# Patient Record
Sex: Female | Born: 1965 | Race: Asian | Hispanic: No | Marital: Married | Smoking: Never smoker
Health system: Southern US, Community
[De-identification: ages and names within clinical notes are randomized; demographics above are authoritative.]

## PROBLEM LIST (undated history)

## (undated) DIAGNOSIS — K219 Gastro-esophageal reflux disease without esophagitis: Secondary | ICD-10-CM

---

## 2009-02-07 ENCOUNTER — Ambulatory Visit: Payer: Self-pay | Admitting: Family Medicine

## 2010-01-21 ENCOUNTER — Ambulatory Visit: Payer: Self-pay | Admitting: Family Medicine

## 2014-04-11 LAB — BASIC METABOLIC PANEL
BUN: 5 mg/dL (ref 4–21)
Creatinine: 0.7 mg/dL (ref 0.5–1.1)

## 2014-04-11 LAB — CBC AND DIFFERENTIAL: HEMOGLOBIN: 13.1 g/dL (ref 12.0–16.0)

## 2014-04-11 LAB — LIPID PANEL
CHOLESTEROL: 229 mg/dL — AB (ref 0–200)
HDL: 61 mg/dL (ref 35–70)
LDL Cholesterol: 151 mg/dL
Triglycerides: 85 mg/dL (ref 40–160)

## 2014-04-11 LAB — TSH: TSH: 1.06 u[IU]/mL (ref 0.41–5.90)

## 2014-06-14 ENCOUNTER — Ambulatory Visit: Payer: Self-pay | Admitting: Internal Medicine

## 2014-06-14 LAB — HM MAMMOGRAPHY: HM MAMMO: NORMAL

## 2015-04-20 ENCOUNTER — Encounter: Payer: Self-pay | Admitting: Internal Medicine

## 2015-04-20 DIAGNOSIS — Z833 Family history of diabetes mellitus: Secondary | ICD-10-CM | POA: Insufficient documentation

## 2016-05-23 ENCOUNTER — Encounter: Payer: Self-pay | Admitting: Internal Medicine

## 2016-09-22 ENCOUNTER — Encounter: Payer: Self-pay | Admitting: Internal Medicine

## 2016-09-22 DIAGNOSIS — E782 Mixed hyperlipidemia: Secondary | ICD-10-CM | POA: Insufficient documentation

## 2016-09-23 ENCOUNTER — Ambulatory Visit (INDEPENDENT_AMBULATORY_CARE_PROVIDER_SITE_OTHER): Payer: BLUE CROSS/BLUE SHIELD | Admitting: Internal Medicine

## 2016-09-23 ENCOUNTER — Encounter: Payer: Self-pay | Admitting: Internal Medicine

## 2016-09-23 VITALS — BP 102/64 | HR 66 | Ht 65.0 in | Wt 151.6 lb

## 2016-09-23 DIAGNOSIS — E782 Mixed hyperlipidemia: Secondary | ICD-10-CM

## 2016-09-23 DIAGNOSIS — Z1231 Encounter for screening mammogram for malignant neoplasm of breast: Secondary | ICD-10-CM | POA: Diagnosis not present

## 2016-09-23 DIAGNOSIS — Z833 Family history of diabetes mellitus: Secondary | ICD-10-CM | POA: Diagnosis not present

## 2016-09-23 DIAGNOSIS — Z0000 Encounter for general adult medical examination without abnormal findings: Secondary | ICD-10-CM

## 2016-09-23 DIAGNOSIS — Z1211 Encounter for screening for malignant neoplasm of colon: Secondary | ICD-10-CM | POA: Diagnosis not present

## 2016-09-23 DIAGNOSIS — Z1239 Encounter for other screening for malignant neoplasm of breast: Secondary | ICD-10-CM

## 2016-09-23 LAB — POCT URINALYSIS DIPSTICK
Bilirubin, UA: NEGATIVE
Glucose, UA: NEGATIVE
Ketones, UA: NEGATIVE
Leukocytes, UA: NEGATIVE
NITRITE UA: NEGATIVE
PH UA: 7.5 (ref 5.0–8.0)
PROTEIN UA: NEGATIVE
RBC UA: NEGATIVE
UROBILINOGEN UA: 0.2 U/dL

## 2016-09-23 NOTE — Patient Instructions (Addendum)
Call to schedule mammogram [phone] Breast Self-Awareness Breast self-awareness means being familiar with how your breasts look and feel. It involves checking your breasts regularly and reporting any changes to your health care provider. Practicing breast self-awareness is important. A change in your breasts can be a sign of a serious medical problem. Being familiar with how your breasts look and feel allows you to find any problems early, when treatment is more likely to be successful. All women should practice breast self-awareness, including women who have had breast implants. How to do a breast self-exam One way to learn what is normal for your breasts and whether your breasts are changing is to do a breast self-exam. To do a breast self-exam: Look for Changes   1. Remove all the clothing above your waist. 2. Stand in front of a mirror in a room with good lighting. 3. Put your hands on your hips. 4. Push your hands firmly downward. 5. Compare your breasts in the mirror. Look for differences between them (asymmetry), such as:  Differences in shape.  Differences in size.  Puckers, dips, and bumps in one breast and not the other. 6. Look at each breast for changes in your skin, such as:  Redness.  Scaly areas. 7. Look for changes in your nipples, such as:  Discharge.  Bleeding.  Dimpling.  Redness.  A change in position. Feel for Changes   Carefully feel your breasts for lumps and changes. It is best to do this while lying on your back on the floor and again while sitting or standing in the shower or tub with soapy water on your skin. Feel each breast in the following way:  Place the arm on the side of the breast you are examining above your head.  Feel your breast with the other hand.  Start in the nipple area and make  inch (2 cm) overlapping circles to feel your breast. Use the pads of your three middle fingers to do this. Apply light pressure, then medium  pressure, then firm pressure. The light pressure will allow you to feel the tissue closest to the skin. The medium pressure will allow you to feel the tissue that is a little deeper. The firm pressure will allow you to feel the tissue close to the ribs.  Continue the overlapping circles, moving downward over the breast until you feel your ribs below your breast.  Move one finger-width toward the center of the body. Continue to use the  inch (2 cm) overlapping circles to feel your breast as you move slowly up toward your collarbone.  Continue the up and down exam using all three pressures until you reach your armpit. Write Down What You Find   Write down what is normal for each breast and any changes that you find. Keep a written record with breast changes or normal findings for each breast. By writing this information down, you do not need to depend only on memory for size, tenderness, or location. Write down where you are in your menstrual cycle, if you are still menstruating. If you are having trouble noticing differences in your breasts, do not get discouraged. With time you will become more familiar with the variations in your breasts and more comfortable with the exam. How often should I examine my breasts? Examine your breasts every month. If you are breastfeeding, the best time to examine your breasts is after a feeding or after using a breast pump. If you menstruate, the best time to examine  your breasts is 5-7 days after your period is over. During your period, your breasts are lumpier, and it may be more difficult to notice changes. When should I see my health care provider? See your health care provider if you notice:  A change in shape or size of your breasts or nipples.  A change in the skin of your breast or nipples, such as a reddened or scaly area.  Unusual discharge from your nipples.  A lump or thick area that was not there before.  Pain in your breasts.  Anything that  concerns you. This information is not intended to replace advice given to you by your health care provider. Make sure you discuss any questions you have with your health care provider. Document Released: 05/12/2005 Document Revised: 10/18/2015 Document Reviewed: 04/01/2015 Elsevier Interactive Patient Education  2017 [employer].

## 2016-09-23 NOTE — Progress Notes (Signed)
Date:  09/23/2016   Name:  Brittany Wiley   DOB:  [birth year]   MRN:  [id number]   Chief Complaint: Annual Exam (pap smear and breast exam. ) Brittany Wiley is a 51 y.o. female who presents today for her Complete Annual Exam. She feels well. She reports exercising none but has a physical job. She reports she is sleeping poorly. She does not want to take any otc or prescription medication at this time.  She is due for a mammogram.  Last PAP was a number of years ago in [place].  She has not had a colonoscopy.  There is no family hx of colon cancer.   Review of Systems  Constitutional: Negative for appetite change, chills, fatigue, fever and unexpected weight change.  HENT: Negative for hearing loss and trouble swallowing.   Eyes: Negative for visual disturbance.  Respiratory: Negative for cough, chest tightness, shortness of breath and wheezing.   Cardiovascular: Negative for chest pain.  Gastrointestinal: Negative for abdominal pain, constipation and diarrhea.  Genitourinary: Negative for difficulty urinating, frequency and menstrual problem.  Musculoskeletal: Negative for arthralgias.  Skin: Negative for color change and rash.  Neurological: Negative for dizziness, numbness and headaches.  Hematological: Negative for adenopathy.  Psychiatric/Behavioral: Positive for sleep disturbance. Negative for dysphoric mood.    Patient Active Problem List   Diagnosis Date Noted  . Hyperlipidemia, mixed 09/22/2016  . Family history of diabetes mellitus 04/20/2015    Prior to Admission medications   Not on File    No Known Allergies  History reviewed. No pertinent surgical history.  Social History  Substance Use Topics  . Smoking status: Never Smoker  . Smokeless tobacco: Never Used  . Alcohol use 0.0 oz/week     Comment: rarely     Medication list has been reviewed and updated.   Physical Exam  Constitutional: She is oriented to person, place, and time. She appears well-developed  and well-nourished. No distress.  HENT:  Head: Normocephalic and atraumatic.  Right Ear: Tympanic membrane and ear canal normal.  Left Ear: Tympanic membrane and ear canal normal.  Nose: Right sinus exhibits no maxillary sinus tenderness. Left sinus exhibits no maxillary sinus tenderness.  Mouth/Throat: Uvula is midline and oropharynx is clear and moist.  Eyes: Conjunctivae and EOM are normal. Right eye exhibits no discharge. Left eye exhibits no discharge. No scleral icterus.  Neck: Normal range of motion. Carotid bruit is not present. No erythema present. No thyromegaly present.  Cardiovascular: Normal rate, regular rhythm, normal heart sounds and normal pulses.   Pulmonary/Chest: Effort normal. No respiratory distress. She has no wheezes. Right breast exhibits no mass, no nipple discharge, no skin change and no tenderness. Left breast exhibits no mass, no nipple discharge, no skin change and no tenderness.  Abdominal: Soft. Bowel sounds are normal. There is no hepatosplenomegaly. There is no tenderness. There is no CVA tenderness.  Genitourinary: Vagina normal and uterus normal. There is no tenderness, lesion or injury on the right labia. There is no tenderness, lesion or injury on the left labia. Cervix exhibits no motion tenderness, no discharge and no friability. Right adnexum displays no mass, no tenderness and no fullness. Left adnexum displays no mass, no tenderness and no fullness.  Musculoskeletal: Normal range of motion. She exhibits no edema or tenderness.  Lymphadenopathy:    She has no cervical adenopathy.    She has no axillary adenopathy.  Neurological: She is alert and oriented to person, place, and time.  She has normal reflexes. No cranial nerve deficit or sensory deficit.  Skin: Skin is warm, dry and intact. No rash noted.  Psychiatric: She has a normal mood and affect. Her speech is normal and behavior is normal. Thought content normal.  Nursing note and vitals  reviewed.   BP 102/64 (BP Location: Right Arm, Patient Position: Sitting, Cuff Size: Large)   Pulse 66   Ht 5\' 5"  (1.651 m)   Wt 151 lb 9.6 oz (68.8 kg)   LMP 09/09/2016 (Within Weeks)   SpO2 100%   BMI 25.23 kg/m   Assessment and Plan: 1. Annual physical exam Resume regular exercise Consider Melatonin for sleep - CBC with Differential/Platelet - TSH - POCT urinalysis dipstick - Pap IG and HPV (high risk) DNA detection  2. Breast cancer screening - MM DIGITAL SCREENING BILATERAL; Future  3. Hyperlipidemia, mixed Will advise if medication is needed - Lipid panel  4. Family history of diabetes mellitus - Comprehensive metabolic panel - Hemoglobin A1c  5. Colon cancer screening - Ambulatory referral to Gastroenterology   No orders of the defined types were placed in this encounter.   Halina Maidens, MD [facility]  09/23/2016

## 2016-09-24 ENCOUNTER — Other Ambulatory Visit: Payer: Self-pay

## 2016-09-24 DIAGNOSIS — Z1211 Encounter for screening for malignant neoplasm of colon: Secondary | ICD-10-CM

## 2016-09-24 LAB — CBC WITH DIFFERENTIAL/PLATELET
Basophils Absolute: 0.1 10*3/uL (ref 0.0–0.2)
Basos: 1 %
EOS (ABSOLUTE): 0.2 10*3/uL (ref 0.0–0.4)
EOS: 4 %
HEMATOCRIT: 37.9 % (ref 34.0–46.6)
Hemoglobin: 12 g/dL (ref 11.1–15.9)
Immature Grans (Abs): 0 10*3/uL (ref 0.0–0.1)
Immature Granulocytes: 0 %
LYMPHS ABS: 1.8 10*3/uL (ref 0.7–3.1)
Lymphs: 38 %
MCH: 29.1 pg (ref 26.6–33.0)
MCHC: 31.7 g/dL (ref 31.5–35.7)
MCV: 92 fL (ref 79–97)
MONOS ABS: 0.3 10*3/uL (ref 0.1–0.9)
Monocytes: 6 %
Neutrophils Absolute: 2.4 10*3/uL (ref 1.4–7.0)
Neutrophils: 51 %
PLATELETS: 293 10*3/uL (ref 150–379)
RBC: 4.12 x10E6/uL (ref 3.77–5.28)
RDW: 15.1 % (ref 12.3–15.4)
WBC: 4.7 10*3/uL (ref 3.4–10.8)

## 2016-09-24 LAB — COMPREHENSIVE METABOLIC PANEL
A/G RATIO: 1.4 (ref 1.2–2.2)
ALK PHOS: 60 IU/L (ref 39–117)
ALT: 12 IU/L (ref 0–32)
AST: 16 IU/L (ref 0–40)
Albumin: 4.1 g/dL (ref 3.5–5.5)
BUN/Creatinine Ratio: 7 — ABNORMAL LOW (ref 9–23)
BUN: 5 mg/dL — AB (ref 6–24)
Bilirubin Total: 0.4 mg/dL (ref 0.0–1.2)
CHLORIDE: 102 mmol/L (ref 96–106)
CO2: 22 mmol/L (ref 18–29)
CREATININE: 0.73 mg/dL (ref 0.57–1.00)
Calcium: 8.5 mg/dL — ABNORMAL LOW (ref 8.7–10.2)
GFR, EST AFRICAN AMERICAN: 111 mL/min/{1.73_m2} (ref >59)
GFR, EST NON AFRICAN AMERICAN: 96 mL/min/{1.73_m2} (ref >59)
GLOBULIN, TOTAL: 2.9 g/dL (ref 1.5–4.5)
Glucose: 76 mg/dL (ref 65–99)
Potassium: 3.7 mmol/L (ref 3.5–5.2)
Sodium: 140 mmol/L (ref 134–144)
Total Protein: 7 g/dL (ref 6.0–8.5)

## 2016-09-24 LAB — LIPID PANEL
Chol/HDL Ratio: 3.2 ratio (ref 0.0–4.4)
Cholesterol, Total: 205 mg/dL — ABNORMAL HIGH (ref 100–199)
HDL: 64 mg/dL (ref >39)
LDL CALC: 123 mg/dL — AB (ref 0–99)
Triglycerides: 91 mg/dL (ref 0–149)
VLDL Cholesterol Cal: 18 mg/dL (ref 5–40)

## 2016-09-24 LAB — HEMOGLOBIN A1C
Est. average glucose Bld gHb Est-mCnc: 108 mg/dL
Hgb A1c MFr Bld: 5.4 % (ref 4.8–5.6)

## 2016-09-24 LAB — TSH: TSH: 1.61 u[IU]/mL (ref 0.450–4.500)

## 2016-09-25 ENCOUNTER — Telehealth: Payer: Self-pay | Admitting: Gastroenterology

## 2016-09-25 LAB — PAP IG AND HPV HIGH-RISK
HPV, high-risk: NEGATIVE
PAP SMEAR COMMENT: 0

## 2016-09-25 NOTE — Telephone Encounter (Signed)
09/25/16 Spoke with Fabiola Backer at [facility] and NO prior Josem Kaufmann is required for Colonoscopy [phone] / Z12.11

## 2016-09-26 NOTE — Progress Notes (Signed)
Pt informed of normal pap negative HPV

## 2016-10-17 ENCOUNTER — Encounter: Payer: Self-pay | Admitting: *Deleted

## 2016-10-21 ENCOUNTER — Encounter
Admission: RE | Disposition: A | Discharge status: Home / Self Care | Payer: Self-pay | Source: Ambulatory Visit | Attending: Gastroenterology

## 2016-10-21 ENCOUNTER — Ambulatory Visit
Admission: RE | Admit: 2016-10-21 | Discharge: 2016-10-21 | Disposition: A | Discharge status: Home / Self Care | Payer: BLUE CROSS/BLUE SHIELD | Source: Ambulatory Visit | Attending: Gastroenterology | Admitting: Gastroenterology

## 2016-10-21 ENCOUNTER — Ambulatory Visit: Payer: BLUE CROSS/BLUE SHIELD | Admitting: Certified Registered"

## 2016-10-21 DIAGNOSIS — K5730 Diverticulosis of large intestine without perforation or abscess without bleeding: Secondary | ICD-10-CM | POA: Insufficient documentation

## 2016-10-21 DIAGNOSIS — D125 Benign neoplasm of sigmoid colon: Secondary | ICD-10-CM | POA: Diagnosis not present

## 2016-10-21 DIAGNOSIS — K648 Other hemorrhoids: Secondary | ICD-10-CM | POA: Insufficient documentation

## 2016-10-21 DIAGNOSIS — D123 Benign neoplasm of transverse colon: Secondary | ICD-10-CM | POA: Insufficient documentation

## 2016-10-21 DIAGNOSIS — Z1211 Encounter for screening for malignant neoplasm of colon: Secondary | ICD-10-CM | POA: Diagnosis present

## 2016-10-21 DIAGNOSIS — K219 Gastro-esophageal reflux disease without esophagitis: Secondary | ICD-10-CM | POA: Diagnosis not present

## 2016-10-21 DIAGNOSIS — K635 Polyp of colon: Secondary | ICD-10-CM

## 2016-10-21 HISTORY — DX: Gastro-esophageal reflux disease without esophagitis: K21.9

## 2016-10-21 HISTORY — PX: COLONOSCOPY WITH PROPOFOL: SHX5780

## 2016-10-21 LAB — POCT PREGNANCY, URINE: Preg Test, Ur: NEGATIVE

## 2016-10-21 SURGERY — COLONOSCOPY WITH PROPOFOL
Anesthesia: General

## 2016-10-21 MED ORDER — PROPOFOL 500 MG/50ML IV EMUL
INTRAVENOUS | Status: AC
  Filled 2016-10-21: qty 50

## 2016-10-21 MED ORDER — PROPOFOL 10 MG/ML IV BOLUS
INTRAVENOUS | Status: DC | PRN
  Administered 2016-10-21: 70 mg via INTRAVENOUS
  Administered 2016-10-21: 30 mg via INTRAVENOUS

## 2016-10-21 MED ORDER — PROPOFOL 500 MG/50ML IV EMUL
INTRAVENOUS | Status: DC | PRN
  Administered 2016-10-21: 120 ug/kg/min via INTRAVENOUS

## 2016-10-21 MED ORDER — SODIUM CHLORIDE 0.9 % IV SOLN
INTRAVENOUS | Status: DC
  Administered 2016-10-21: 09:00:00 via INTRAVENOUS

## 2016-10-21 NOTE — Transfer of Care (Signed)
Immediate Anesthesia Transfer of Care Note  Patient: Brittany Wiley  Procedure(s) Performed: Procedure(s): COLONOSCOPY WITH PROPOFOL (N/A)  Patient Location: Endoscopy Unit  Anesthesia Type:General  Level of Consciousness: awake  Airway & Oxygen Therapy: Patient Spontanous Breathing and Patient connected to nasal cannula oxygen  Post-op Assessment: Report given to RN and Post -op Vital signs reviewed and stable  Post vital signs: Reviewed  Last Vitals:  Vitals:   10/21/16 0843 10/21/16 0933  BP: 121/70 (!) 89/58  Pulse: 65 75  Resp: 16 18  Temp: 36.4 C (!) 35.8 C    Last Pain:  Vitals:   10/21/16 0843  TempSrc: Tympanic         Complications: No apparent anesthesia complications

## 2016-10-21 NOTE — H&P (Signed)
   Lucilla Lame, MD [facility] [address] [place], [place] [address] Phone: [phone] Fax : [phone]  Primary Care Physician:  Glean Hess, MD Primary Gastroenterologist:  Dr. Allen Norris  Pre-Procedure History & Physical: HPI:  Brittany Wiley is a 51 y.o. female is here for a screening colonoscopy.   Past Medical History:  Diagnosis Date  . GERD (gastroesophageal reflux disease)     Past Surgical History:  Procedure Laterality Date  . CESAREAN SECTION      Prior to Admission medications   Not on File    Allergies as of 09/24/2016  . (No Known Allergies)    Family History  Problem Relation Age of Onset  . Hypertension Father   . Diabetes Mother     Social History   Social History  . Marital status: Married    Spouse name: N/A  . Number of children: N/A  . Years of education: N/A   Occupational History  . Not on file.   Social History Main Topics  . Smoking status: Never Smoker  . Smokeless tobacco: Never Used  . Alcohol use 0.0 oz/week     Comment: rarely  . Drug use: No  . Sexual activity: Yes   Other Topics Concern  . Not on file   Social History Narrative  . No narrative on file    Review of Systems: See HPI, otherwise negative ROS  Physical Exam: BP 121/70   Pulse 65   Temp 97.5 F (36.4 C) (Tympanic)   Resp 16   Ht 5\' 7"  (1.702 m)   Wt 155 lb (70.3 kg)   LMP 10/21/2016   SpO2 100%   BMI 24.28 kg/m  General:   Alert,  pleasant and cooperative in NAD Head:  Normocephalic and atraumatic. Neck:  Supple; no masses or thyromegaly. Lungs:  Clear throughout to auscultation.    Heart:  Regular rate and rhythm. Abdomen:  Soft, nontender and nondistended. Normal bowel sounds, without guarding, and without rebound.   Neurologic:  Alert and  oriented x4;  grossly normal neurologically.  Impression/Plan: Brittany Wiley is now here to undergo a screening colonoscopy.  Risks, benefits, and alternatives regarding colonoscopy have been  reviewed with the patient.  Questions have been answered.  All parties agreeable.

## 2016-10-21 NOTE — Anesthesia Postprocedure Evaluation (Signed)
Anesthesia Post Note  Patient: Brittany Wiley  Procedure(s) Performed: Procedure(s) (LRB): COLONOSCOPY WITH PROPOFOL (N/A)  Patient location during evaluation: Endoscopy Anesthesia Type: General Level of consciousness: awake and alert Pain management: pain level controlled Vital Signs Assessment: post-procedure vital signs reviewed and stable Respiratory status: spontaneous breathing, nonlabored ventilation, respiratory function stable and patient connected to nasal cannula oxygen Cardiovascular status: blood pressure returned to baseline and stable Postop Assessment: no signs of nausea or vomiting Anesthetic complications: no     Last Vitals:  Vitals:   10/21/16 0943 10/21/16 0953  BP: 111/77 117/73  Pulse:    Resp:    Temp:      Last Pain:  Vitals:   10/21/16 0933  TempSrc: Oral                 [name]

## 2016-10-21 NOTE — Anesthesia Preprocedure Evaluation (Signed)
Anesthesia Evaluation  Patient identified by MRN, date of birth, ID band Patient awake    Reviewed: Allergy & Precautions, H&P , NPO status , Patient's Chart, lab work & pertinent test results  History of Anesthesia Complications Negative for: history of anesthetic complications  Airway Mallampati: III  TM Distance: >3 FB Neck ROM: full    Dental  (+) Chipped, Caps   Pulmonary neg pulmonary ROS, neg shortness of breath,    Pulmonary exam normal breath sounds clear to auscultation       Cardiovascular Exercise Tolerance: Good (-) angina(-) Past MI and (-) DOE negative cardio ROS Normal cardiovascular exam Rhythm:regular Rate:Normal     Neuro/Psych negative neurological ROS  negative psych ROS   GI/Hepatic Neg liver ROS, GERD  Controlled,  Endo/Other  negative endocrine ROS  Renal/GU negative Renal ROS  negative genitourinary   Musculoskeletal   Abdominal   Peds  Hematology negative hematology ROS (+)   Anesthesia Other Findings Past Medical History: No date: GERD (gastroesophageal reflux disease)  Past Surgical History: No date: CESAREAN SECTION  BMI    Body Mass Index:  24.28 kg/m      Reproductive/Obstetrics negative OB ROS                             Anesthesia Physical Anesthesia Plan  ASA: II  Anesthesia Plan: General   Post-op Pain Management:    Induction: Intravenous  Airway Management Planned: Natural Airway and Nasal Cannula  Additional Equipment:   Intra-op Plan:   Post-operative Plan:   Informed Consent: I have reviewed the patients History and Physical, chart, labs and discussed the procedure including the risks, benefits and alternatives for the proposed anesthesia with the patient or authorized representative who has indicated his/her understanding and acceptance.   Dental Advisory Given  Plan Discussed with: Anesthesiologist, CRNA and  Surgeon  Anesthesia Plan Comments: (Patient consented for risks of anesthesia including but not limited to:  - adverse reactions to medications - damage to teeth, lips or other oral mucosa - sore throat or hoarseness - Damage to heart, brain, lungs or loss of life  Patient voiced understanding.)        Anesthesia Quick Evaluation

## 2016-10-21 NOTE — Op Note (Signed)
[facility] Patient Name: Brittany Wiley Procedure Date: 10/21/2016 9:11 AM MRN: [id number] Account #: [id number] Date of Birth: 04/27/1966 Admit Type: Outpatient Age: 51 Room: [facility] ENDO ROOM 4 Gender: Female Note Status: Finalized Procedure:            Colonoscopy Indications:          Screening for colorectal malignant neoplasm Providers:            Lucilla Lame MD, MD Referring MD:         Halina Maidens, MD (Referring MD) Medicines:            Propofol per Anesthesia Complications:        No immediate complications. Procedure:            Pre-Anesthesia Assessment:                       - Prior to the procedure, a History and Physical was                        performed, and patient medications and allergies were                        reviewed. The patient's tolerance of previous                        anesthesia was also reviewed. The risks and benefits of                        the procedure and the sedation options and risks were                        discussed with the patient. All questions were                        answered, and informed consent was obtained. Prior                        Anticoagulants: The patient has taken no previous                        anticoagulant or antiplatelet agents. ASA Grade                        Assessment: II - A patient with mild systemic disease.                        After reviewing the risks and benefits, the patient was                        deemed in satisfactory condition to undergo the                        procedure.                       After obtaining informed consent, the colonoscope was                        passed under direct vision. Throughout the procedure,  the patient's blood pressure, pulse, and oxygen                        saturations were monitored continuously. The                        Colonoscope was introduced through the anus and                         advanced to the the cecum, identified by appendiceal                        orifice and ileocecal valve. The colonoscopy was                        performed without difficulty. The patient tolerated the                        procedure well. The quality of the bowel preparation                        was excellent. Findings:      The perianal and digital rectal examinations were normal.      A 4 mm polyp was found in the sigmoid colon. The polyp was sessile. The       polyp was removed with a cold snare. Resection and retrieval were       complete.      A few small-mouthed diverticula were found in the ascending colon.      Non-bleeding internal hemorrhoids were found during retroflexion. The       hemorrhoids were Grade I (internal hemorrhoids that do not prolapse). Impression:           - One 4 mm polyp in the sigmoid colon, removed with a                        cold snare. Resected and retrieved.                       - Diverticulosis in the ascending colon.                       - Non-bleeding internal hemorrhoids. Recommendation:       - Discharge patient to home.                       - Resume previous diet.                       - Continue present medications.                       - Await pathology results.                       - Repeat colonoscopy in 5 years if polyp adenoma and 10                        years if hyperplastic Procedure Code(s):    --- Professional ---                       [phone],  Colonoscopy, flexible; with removal of tumor(s),                        polyp(s), or other lesion(s) by snare technique Diagnosis Code(s):    --- Professional ---                       Z12.11, Encounter for screening for malignant neoplasm                        of colon                       D12.5, Benign neoplasm of sigmoid colon CPT copyright 2016 American [employer]. All rights reserved. The codes documented in this report are preliminary and upon coder review may   be revised to meet current compliance requirements. Lucilla Lame MD, MD 10/21/2016 9:31:23 AM This report has been signed electronically. Number of Addenda: 0 Note Initiated On: 10/21/2016 9:11 AM Scope Withdrawal Time: 0 hours 6 minutes 52 seconds  Total Procedure Duration: 0 hours 8 minutes 57 seconds       [facility]

## 2016-10-21 NOTE — Anesthesia Post-op Follow-up Note (Cosign Needed)
Anesthesia QCDR form completed.        

## 2016-10-22 ENCOUNTER — Encounter: Payer: Self-pay | Admitting: Gastroenterology

## 2016-10-22 LAB — SURGICAL PATHOLOGY

## 2016-10-23 ENCOUNTER — Encounter: Payer: Self-pay | Admitting: Gastroenterology

## 2017-02-03 ENCOUNTER — Ambulatory Visit
Admission: RE | Admit: 2017-02-03 | Discharge: 2017-02-03 | Disposition: A | Discharge status: Home / Self Care | Payer: BLUE CROSS/BLUE SHIELD | Source: Ambulatory Visit | Attending: Internal Medicine | Admitting: Internal Medicine

## 2017-02-03 DIAGNOSIS — Z1231 Encounter for screening mammogram for malignant neoplasm of breast: Secondary | ICD-10-CM | POA: Insufficient documentation

## 2017-02-03 DIAGNOSIS — R928 Other abnormal and inconclusive findings on diagnostic imaging of breast: Secondary | ICD-10-CM | POA: Insufficient documentation

## 2017-02-03 DIAGNOSIS — N6489 Other specified disorders of breast: Secondary | ICD-10-CM | POA: Diagnosis not present

## 2017-02-03 DIAGNOSIS — Z1239 Encounter for other screening for malignant neoplasm of breast: Secondary | ICD-10-CM

## 2017-02-05 ENCOUNTER — Other Ambulatory Visit: Payer: Self-pay | Admitting: Internal Medicine

## 2017-02-05 DIAGNOSIS — N6489 Other specified disorders of breast: Secondary | ICD-10-CM

## 2017-02-05 DIAGNOSIS — R928 Other abnormal and inconclusive findings on diagnostic imaging of breast: Secondary | ICD-10-CM

## 2017-02-16 ENCOUNTER — Ambulatory Visit
Admission: RE | Admit: 2017-02-16 | Discharge: 2017-02-16 | Disposition: A | Discharge status: Home / Self Care | Payer: BLUE CROSS/BLUE SHIELD | Source: Ambulatory Visit | Attending: Internal Medicine | Admitting: Internal Medicine

## 2017-02-16 DIAGNOSIS — R928 Other abnormal and inconclusive findings on diagnostic imaging of breast: Secondary | ICD-10-CM

## 2017-02-16 DIAGNOSIS — N6489 Other specified disorders of breast: Secondary | ICD-10-CM | POA: Diagnosis present

## 2017-10-06 ENCOUNTER — Encounter: Payer: Self-pay | Admitting: Internal Medicine

## 2017-10-06 ENCOUNTER — Ambulatory Visit (INDEPENDENT_AMBULATORY_CARE_PROVIDER_SITE_OTHER): Payer: BLUE CROSS/BLUE SHIELD | Admitting: Internal Medicine

## 2017-10-06 VITALS — BP 103/68 | HR 70 | Resp 16 | Ht 67.0 in | Wt 155.6 lb

## 2017-10-06 DIAGNOSIS — E782 Mixed hyperlipidemia: Secondary | ICD-10-CM

## 2017-10-06 DIAGNOSIS — Z0000 Encounter for general adult medical examination without abnormal findings: Secondary | ICD-10-CM | POA: Diagnosis not present

## 2017-10-06 DIAGNOSIS — Z1231 Encounter for screening mammogram for malignant neoplasm of breast: Secondary | ICD-10-CM

## 2017-10-06 DIAGNOSIS — F5101 Primary insomnia: Secondary | ICD-10-CM

## 2017-10-06 DIAGNOSIS — Z1239 Encounter for other screening for malignant neoplasm of breast: Secondary | ICD-10-CM

## 2017-10-06 LAB — POCT URINALYSIS DIPSTICK
Bilirubin, UA: NEGATIVE
GLUCOSE UA: NEGATIVE
Ketones, UA: NEGATIVE
Leukocytes, UA: NEGATIVE
Nitrite, UA: NEGATIVE
Protein, UA: NEGATIVE
RBC UA: NEGATIVE
Spec Grav, UA: 1.025 (ref 1.010–1.025)
Urobilinogen, UA: 0.2 E.U./dL
pH, UA: 6.5 (ref 5.0–8.0)

## 2017-10-06 NOTE — Patient Instructions (Addendum)
Melatonin - take 5 to 10 mg at bedtime  Health Maintenance, Female Adopting a healthy lifestyle and getting preventive care can go a long way to promote health and wellness. Talk with your health care provider about what schedule of regular examinations is right for you. This is a good chance for you to check in with your provider about disease prevention and staying healthy. In between checkups, there are plenty of things you can do on your own. Experts have done a lot of research about which lifestyle changes and preventive measures are most likely to keep you healthy. Ask your health care provider for more information. Weight and diet Eat a healthy diet  Be sure to include plenty of vegetables, fruits, low-fat dairy products, and lean protein.  Do not eat a lot of foods high in solid fats, added sugars, or salt.  Get regular exercise. This is one of the most important things you can do for your health. ? Most adults should exercise for at least 150 minutes each week. The exercise should increase your heart rate and make you sweat (moderate-intensity exercise). ? Most adults should also do strengthening exercises at least twice a week. This is in addition to the moderate-intensity exercise.  Maintain a healthy weight  Body mass index (BMI) is a measurement that can be used to identify possible weight problems. It estimates body fat based on height and weight. Your health care provider can help determine your BMI and help you achieve or maintain a healthy weight.  For females 70 years of age and older: ? A BMI below 18.5 is considered underweight. ? A BMI of 18.5 to 24.9 is normal. ? A BMI of 25 to 29.9 is considered overweight. ? A BMI of 30 and above is considered obese.  Watch levels of cholesterol and blood lipids  You should start having your blood tested for lipids and cholesterol at 52 years of age, then have this test every 5 years.  You may need to have your cholesterol levels  checked more often if: ? Your lipid or cholesterol levels are high. ? You are older than 52 years of age. ? You are at high risk for heart disease.  Cancer screening Lung Cancer  Lung cancer screening is recommended for adults 73-41 years old who are at high risk for lung cancer because of a history of smoking.  A yearly low-dose CT scan of the lungs is recommended for people who: ? Currently smoke. ? Have quit within the past 15 years. ? Have at least a 30-pack-year history of smoking. A pack year is smoking an average of one pack of cigarettes a day for 1 year.  Yearly screening should continue until it has been 15 years since you quit.  Yearly screening should stop if you develop a health problem that would prevent you from having lung cancer treatment.  Breast Cancer  Practice breast self-awareness. This means understanding how your breasts normally appear and feel.  It also means doing regular breast self-exams. Let your health care provider know about any changes, no matter how small.  If you are in your 20s or 30s, you should have a clinical breast exam (CBE) by a health care provider every 1-3 years as part of a regular health exam.  If you are 59 or older, have a CBE every year. Also consider having a breast X-ray (mammogram) every year.  If you have a family history of breast cancer, talk to your health care provider  about genetic screening.  If you are at high risk for breast cancer, talk to your health care provider about having an MRI and a mammogram every year.  Breast cancer gene (BRCA) assessment is recommended for women who have family members with BRCA-related cancers. BRCA-related cancers include: ? Breast. ? Ovarian. ? Tubal. ? Peritoneal cancers.  Results of the assessment will determine the need for genetic counseling and BRCA1 and BRCA2 testing.  Cervical Cancer Your health care provider may recommend that you be screened regularly for cancer of the  pelvic organs (ovaries, uterus, and vagina). This screening involves a pelvic examination, including checking for microscopic changes to the surface of your cervix (Pap test). You may be encouraged to have this screening done every 3 years, beginning at age 3.  For women ages 67-65, health care providers may recommend pelvic exams and Pap testing every 3 years, or they may recommend the Pap and pelvic exam, combined with testing for human papilloma virus (HPV), every 5 years. Some types of HPV increase your risk of cervical cancer. Testing for HPV may also be done on women of any age with unclear Pap test results.  Other health care providers may not recommend any screening for nonpregnant women who are considered low risk for pelvic cancer and who do not have symptoms. Ask your health care provider if a screening pelvic exam is right for you.  If you have had past treatment for cervical cancer or a condition that could lead to cancer, you need Pap tests and screening for cancer for at least 20 years after your treatment. If Pap tests have been discontinued, your risk factors (such as having a new sexual partner) need to be reassessed to determine if screening should resume. Some women have medical problems that increase the chance of getting cervical cancer. In these cases, your health care provider may recommend more frequent screening and Pap tests.  Colorectal Cancer  This type of cancer can be detected and often prevented.  Routine colorectal cancer screening usually begins at 52 years of age and continues through 52 years of age.  Your health care provider may recommend screening at an earlier age if you have risk factors for colon cancer.  Your health care provider may also recommend using home test kits to check for hidden blood in the stool.  A small camera at the end of a tube can be used to examine your colon directly (sigmoidoscopy or colonoscopy). This is done to check for the  earliest forms of colorectal cancer.  Routine screening usually begins at age 46.  Direct examination of the colon should be repeated every 5-10 years through 52 years of age. However, you may need to be screened more often if early forms of precancerous polyps or small growths are found.  Skin Cancer  Check your skin from head to toe regularly.  Tell your health care provider about any new moles or changes in moles, especially if there is a change in a mole's shape or color.  Also tell your health care provider if you have a mole that is larger than the size of a pencil eraser.  Always use sunscreen. Apply sunscreen liberally and repeatedly throughout the day.  Protect yourself by wearing long sleeves, pants, a wide-brimmed hat, and sunglasses whenever you are outside.  Heart disease, diabetes, and high blood pressure  High blood pressure causes heart disease and increases the risk of stroke. High blood pressure is more likely to develop in: ?  People who have blood pressure in the high end of the normal range (130-139/85-89 mm Hg). ? People who are overweight or obese. ? People who are African American.  If you are 18-39 years of age, have your blood pressure checked every 3-5 years. If you are 40 years of age or older, have your blood pressure checked every year. You should have your blood pressure measured twice-once when you are at a hospital or clinic, and once when you are not at a hospital or clinic. Record the average of the two measurements. To check your blood pressure when you are not at a hospital or clinic, you can use: ? An automated blood pressure machine at a pharmacy. ? A home blood pressure monitor.  If you are between 55 years and 79 years old, ask your health care provider if you should take aspirin to prevent strokes.  Have regular diabetes screenings. This involves taking a blood sample to check your fasting blood sugar level. ? If you are at a normal weight and  have a low risk for diabetes, have this test once every three years after 52 years of age. ? If you are overweight and have a high risk for diabetes, consider being tested at a younger age or more often. Preventing infection Hepatitis B  If you have a higher risk for hepatitis B, you should be screened for this virus. You are considered at high risk for hepatitis B if: ? You were born in a country where hepatitis B is common. Ask your health care provider which countries are considered high risk. ? Your parents were born in a high-risk country, and you have not been immunized against hepatitis B (hepatitis B vaccine). ? You have HIV or AIDS. ? You use needles to inject street drugs. ? You live with someone who has hepatitis B. ? You have had sex with someone who has hepatitis B. ? You get hemodialysis treatment. ? You take certain medicines for conditions, including cancer, organ transplantation, and autoimmune conditions.  Hepatitis C  Blood testing is recommended for: ? Everyone born from 1945 through 1965. ? Anyone with known risk factors for hepatitis C.  Sexually transmitted infections (STIs)  You should be screened for sexually transmitted infections (STIs) including gonorrhea and chlamydia if: ? You are sexually active and are younger than 52 years of age. ? You are older than 52 years of age and your health care provider tells you that you are at risk for this type of infection. ? Your sexual activity has changed since you were last screened and you are at an increased risk for chlamydia or gonorrhea. Ask your health care provider if you are at risk.  If you do not have HIV, but are at risk, it may be recommended that you take a prescription medicine daily to prevent HIV infection. This is called pre-exposure prophylaxis (PrEP). You are considered at risk if: ? You are sexually active and do not regularly use condoms or know the HIV status of your partner(s). ? You take drugs by  injection. ? You are sexually active with a partner who has HIV.  Talk with your health care provider about whether you are at high risk of being infected with HIV. If you choose to begin PrEP, you should first be tested for HIV. You should then be tested every 3 months for as long as you are taking PrEP. Pregnancy  If you are premenopausal and you may become pregnant, ask your health   care provider about preconception counseling.  If you may become pregnant, take 400 to 800 micrograms (mcg) of folic acid every day.  If you want to prevent pregnancy, talk to your health care provider about birth control (contraception). Osteoporosis and menopause  Osteoporosis is a disease in which the bones lose minerals and strength with aging. This can result in serious bone fractures. Your risk for osteoporosis can be identified using a bone density scan.  If you are 38 years of age or older, or if you are at risk for osteoporosis and fractures, ask your health care provider if you should be screened.  Ask your health care provider whether you should take a calcium or vitamin D supplement to lower your risk for osteoporosis.  Menopause may have certain physical symptoms and risks.  Hormone replacement therapy may reduce some of these symptoms and risks. Talk to your health care provider about whether hormone replacement therapy is right for you. Follow these instructions at home:  Schedule regular health, dental, and eye exams.  Stay current with your immunizations.  Do not use any tobacco products including cigarettes, chewing tobacco, or electronic cigarettes.  If you are pregnant, do not drink alcohol.  If you are breastfeeding, limit how much and how often you drink alcohol.  Limit alcohol intake to no more than 1 drink per day for nonpregnant women. One drink equals 12 ounces of beer, 5 ounces of wine, or 1 ounces of hard liquor.  Do not use street drugs.  Do not share needles.  Ask  your health care provider for help if you need support or information about quitting drugs.  Tell your health care provider if you often feel depressed.  Tell your health care provider if you have ever been abused or do not feel safe at home. This information is not intended to replace advice given to you by your health care provider. Make sure you discuss any questions you have with your health care provider. Document Released: 11/25/2010 Document Revised: 10/18/2015 Document Reviewed: 02/13/2015 Elsevier Interactive Patient Education  [employer].

## 2017-10-06 NOTE — Progress Notes (Signed)
Date:  10/06/2017   Name:  Brittany Wiley   DOB:  08-29-1965   MRN:  [id number]   Chief Complaint: Annual Exam Brittany Wiley is a 52 y.o. female who presents today for her Complete Annual Exam. She feels well. She reports exercising none because she is on her feet at work for 8 hours. She reports she is sleeping poorly -sleep is interrupted for no clear reason.  Her menses are regular - not hot flashes or sweats.  Last Pap normal last year. Colonoscopy done last year was normal.   Review of Systems  Constitutional: Negative for chills, fatigue and fever.  HENT: Negative for congestion, hearing loss, tinnitus, trouble swallowing and voice change.   Eyes: Negative for visual disturbance.  Respiratory: Negative for cough, chest tightness, shortness of breath and wheezing.   Cardiovascular: Negative for chest pain, palpitations and leg swelling.  Gastrointestinal: Negative for abdominal pain, constipation, diarrhea and vomiting.  Endocrine: Negative for polydipsia and polyuria.  Genitourinary: Negative for dysuria, frequency, genital sores, vaginal bleeding and vaginal discharge.  Musculoskeletal: Negative for arthralgias, gait problem and joint swelling.  Skin: Negative for color change and rash.  Neurological: Negative for dizziness, tremors, light-headedness and headaches.  Hematological: Negative for adenopathy. Does not bruise/bleed easily.  Psychiatric/Behavioral: Negative for dysphoric mood and sleep disturbance. The patient is not nervous/anxious.     Patient Active Problem List   Diagnosis Date Noted  . Screen for colon cancer   . Polyp of sigmoid colon   . Hyperlipidemia, mixed 09/22/2016  . Family history of diabetes mellitus 04/20/2015    Prior to Admission medications   Not on File    No Known Allergies  Past Surgical History:  Procedure Laterality Date  . CESAREAN SECTION    . COLONOSCOPY WITH PROPOFOL N/A 10/21/2016   Procedure: COLONOSCOPY WITH PROPOFOL;   Surgeon: Lucilla Lame, MD;  Location: [facility] ENDOSCOPY;  Service: Endoscopy;  Laterality: N/A;    Social History   Tobacco Use  . Smoking status: Never Smoker  . Smokeless tobacco: Never Used  Substance Use Topics  . Alcohol use: Yes    Alcohol/week: 0.0 oz    Comment: rarely  . Drug use: No     Medication list has been reviewed and updated.  PHQ 2/9 Scores 10/06/2017  PHQ - 2 Score 0    Physical Exam  Constitutional: She is oriented to person, place, and time. She appears well-developed. No distress.  HENT:  Head: Normocephalic and atraumatic.  Eyes: Pupils are equal, round, and reactive to light.  Neck: Normal range of motion. Neck supple.  Cardiovascular: Normal rate, regular rhythm and normal heart sounds.  Pulmonary/Chest: Effort normal. No respiratory distress.  Abdominal: Soft. Bowel sounds are normal. She exhibits no distension. There is no tenderness.  Musculoskeletal: Normal range of motion.  Neurological: She is alert and oriented to person, place, and time.  Skin: Skin is warm and dry. No rash noted.  Psychiatric: She has a normal mood and affect. Her behavior is normal. Thought content normal.  Nursing note and vitals reviewed.   BP 103/68   Pulse 70   Resp 16   Ht 5\' 7"  (1.702 m)   Wt 155 lb 9.6 oz (70.6 kg)   LMP 09/15/2017 (Approximate)   SpO2 99%   BMI 24.37 kg/m   Assessment and Plan: 1. Annual physical exam Normal exam Encourage regular exercise Colonoscopy done 2018 Pap done 2018 - CBC with Differential/Platelet - Comprehensive metabolic panel -  Hemoglobin A1c - Lipid panel - POCT urinalysis dipstick  2. Breast cancer screening Schedule mammogram in September - MM DIGITAL SCREENING BILATERAL; Future  3. Hyperlipidemia, mixed Check labs  4. Primary insomnia Recommend trial of Melatonin - TSH   No orders of the defined types were placed in this encounter.   Partially dictated using Editor, commissioning. Any errors are  unintentional.  Halina Maidens, MD [facility]  10/06/2017

## 2017-10-07 LAB — CBC WITH DIFFERENTIAL/PLATELET
BASOS ABS: 0.1 10*3/uL (ref 0.0–0.2)
Basos: 1 %
EOS (ABSOLUTE): 0.2 10*3/uL (ref 0.0–0.4)
Eos: 3 %
Hematocrit: 34.4 % (ref 34.0–46.6)
Hemoglobin: 10.6 g/dL — ABNORMAL LOW (ref 11.1–15.9)
IMMATURE GRANS (ABS): 0 10*3/uL (ref 0.0–0.1)
Immature Granulocytes: 0 %
LYMPHS ABS: 1.9 10*3/uL (ref 0.7–3.1)
LYMPHS: 30 %
MCH: 26.7 pg (ref 26.6–33.0)
MCHC: 30.8 g/dL — AB (ref 31.5–35.7)
MCV: 87 fL (ref 79–97)
Monocytes Absolute: 0.4 10*3/uL (ref 0.1–0.9)
Monocytes: 5 %
NEUTROS ABS: 3.9 10*3/uL (ref 1.4–7.0)
Neutrophils: 61 %
PLATELETS: 332 10*3/uL (ref 150–379)
RBC: 3.97 x10E6/uL (ref 3.77–5.28)
RDW: 15.5 % — ABNORMAL HIGH (ref 12.3–15.4)
WBC: 6.5 10*3/uL (ref 3.4–10.8)

## 2017-10-07 LAB — COMPREHENSIVE METABOLIC PANEL
ALK PHOS: 67 IU/L (ref 39–117)
ALT: 11 IU/L (ref 0–32)
AST: 11 IU/L (ref 0–40)
Albumin/Globulin Ratio: 1.4 (ref 1.2–2.2)
Albumin: 4.2 g/dL (ref 3.5–5.5)
BILIRUBIN TOTAL: 0.4 mg/dL (ref 0.0–1.2)
BUN/Creatinine Ratio: 9 (ref 9–23)
BUN: 6 mg/dL (ref 6–24)
CHLORIDE: 103 mmol/L (ref 96–106)
CO2: 21 mmol/L (ref 20–29)
Calcium: 8.8 mg/dL (ref 8.7–10.2)
Creatinine, Ser: 0.66 mg/dL (ref 0.57–1.00)
GFR calc Af Amer: 118 mL/min/{1.73_m2} (ref >59)
GFR calc non Af Amer: 103 mL/min/{1.73_m2} (ref >59)
GLUCOSE: 81 mg/dL (ref 65–99)
Globulin, Total: 2.9 g/dL (ref 1.5–4.5)
Potassium: 4.3 mmol/L (ref 3.5–5.2)
Sodium: 139 mmol/L (ref 134–144)
Total Protein: 7.1 g/dL (ref 6.0–8.5)

## 2017-10-07 LAB — LIPID PANEL
CHOL/HDL RATIO: 3.6 ratio (ref 0.0–4.4)
CHOLESTEROL TOTAL: 200 mg/dL — AB (ref 100–199)
HDL: 55 mg/dL (ref >39)
LDL Calculated: 123 mg/dL — ABNORMAL HIGH (ref 0–99)
TRIGLYCERIDES: 111 mg/dL (ref 0–149)
VLDL Cholesterol Cal: 22 mg/dL (ref 5–40)

## 2017-10-07 LAB — HEMOGLOBIN A1C
Est. average glucose Bld gHb Est-mCnc: 108 mg/dL
HEMOGLOBIN A1C: 5.4 % (ref 4.8–5.6)

## 2017-10-07 LAB — TSH: TSH: 1.38 u[IU]/mL (ref 0.450–4.500)

## 2018-03-30 ENCOUNTER — Other Ambulatory Visit: Payer: Self-pay | Admitting: Internal Medicine

## 2018-03-30 DIAGNOSIS — Z1231 Encounter for screening mammogram for malignant neoplasm of breast: Secondary | ICD-10-CM

## 2018-04-06 ENCOUNTER — Ambulatory Visit
Admission: RE | Admit: 2018-04-06 | Discharge: 2018-04-06 | Disposition: A | Discharge status: Home / Self Care | Payer: BLUE CROSS/BLUE SHIELD | Source: Ambulatory Visit | Attending: Internal Medicine | Admitting: Internal Medicine

## 2018-04-06 DIAGNOSIS — Z1231 Encounter for screening mammogram for malignant neoplasm of breast: Secondary | ICD-10-CM | POA: Diagnosis present

## 2018-10-12 ENCOUNTER — Ambulatory Visit (INDEPENDENT_AMBULATORY_CARE_PROVIDER_SITE_OTHER): Payer: BLUE CROSS/BLUE SHIELD | Admitting: Internal Medicine

## 2018-10-12 ENCOUNTER — Encounter: Payer: Self-pay | Admitting: Internal Medicine

## 2018-10-12 VITALS — BP 108/64 | HR 79 | Ht 67.0 in | Wt 160.0 lb

## 2018-10-12 DIAGNOSIS — E782 Mixed hyperlipidemia: Secondary | ICD-10-CM | POA: Diagnosis not present

## 2018-10-12 DIAGNOSIS — D649 Anemia, unspecified: Secondary | ICD-10-CM | POA: Diagnosis not present

## 2018-10-12 DIAGNOSIS — Z0000 Encounter for general adult medical examination without abnormal findings: Secondary | ICD-10-CM

## 2018-10-12 DIAGNOSIS — Z1231 Encounter for screening mammogram for malignant neoplasm of breast: Secondary | ICD-10-CM | POA: Diagnosis not present

## 2018-10-12 LAB — POCT URINALYSIS DIPSTICK
Bilirubin, UA: NEGATIVE
Blood, UA: NEGATIVE
Glucose, UA: NEGATIVE
Ketones, UA: NEGATIVE
Leukocytes, UA: NEGATIVE
Nitrite, UA: NEGATIVE
Protein, UA: NEGATIVE
Spec Grav, UA: 1.015 (ref 1.010–1.025)
Urobilinogen, UA: 0.2 E.U./dL
pH, UA: 6 (ref 5.0–8.0)

## 2018-10-12 NOTE — Patient Instructions (Signed)
Ask about TDaP vaccine (tetanus)

## 2018-10-12 NOTE — Progress Notes (Signed)
Date:  10/12/2018   Name:  Brittany Wiley   DOB:  15-Jul-1965   MRN:  [id number]   Chief Complaint: Annual Exam (Breast Exam. Pap in 3 more years.) Paul X Cipriani is a 53 y.o. female who presents today for her Complete Annual Exam. She feels well. She reports exercising none. She reports she is sleeping fairly well.  Mammogram 2019 Colonoscopy 2018 Pap 2018  HPI  Review of Systems  Constitutional: Negative for chills, fatigue and fever.  HENT: Negative for congestion, hearing loss, tinnitus, trouble swallowing and voice change.   Eyes: Negative for visual disturbance.  Respiratory: Negative for cough, chest tightness, shortness of breath and wheezing.   Cardiovascular: Negative for chest pain, palpitations and leg swelling.  Gastrointestinal: Positive for abdominal pain (occasional heartburn). Negative for constipation, diarrhea and vomiting.  Endocrine: Negative for polydipsia and polyuria.  Genitourinary: Negative for dysuria, frequency, genital sores, menstrual problem (still having menses, more often), vaginal bleeding and vaginal discharge.  Musculoskeletal: Negative for arthralgias, gait problem and joint swelling.  Skin: Negative for color change and rash.  Neurological: Negative for dizziness, tremors, light-headedness and headaches.  Hematological: Negative for adenopathy. Does not bruise/bleed easily.  Psychiatric/Behavioral: Negative for dysphoric mood and sleep disturbance. The patient is not nervous/anxious.     Patient Active Problem List   Diagnosis Date Noted  . Screen for colon cancer   . Polyp of sigmoid colon   . Hyperlipidemia, mixed 09/22/2016  . Family history of diabetes mellitus 04/20/2015    No Known Allergies  Past Surgical History:  Procedure Laterality Date  . CESAREAN SECTION    . COLONOSCOPY WITH PROPOFOL N/A 10/21/2016   Procedure: COLONOSCOPY WITH PROPOFOL;  Surgeon: Lucilla Lame, MD;  Location: [facility] ENDOSCOPY;  Service: Endoscopy;  Laterality:  N/A;    Social History   Tobacco Use  . Smoking status: Never Smoker  . Smokeless tobacco: Never Used  Substance Use Topics  . Alcohol use: Yes    Alcohol/week: 0.0 standard drinks    Comment: rarely  . Drug use: No     Medication list has been reviewed and updated.  Current Meds  Medication Sig  . ferrous sulfate 325 (65 FE) MG EC tablet Take 325 mg by mouth 3 (three) times daily with meals.    PHQ 2/9 Scores 10/12/2018 10/06/2017  PHQ - 2 Score 0 0    BP Readings from Last 3 Encounters:  10/12/18 108/64  10/06/17 103/68  10/21/16 117/73    Physical Exam Vitals signs and nursing note reviewed.  Constitutional:      General: She is not in acute distress.    Appearance: She is well-developed.  HENT:     Head: Normocephalic and atraumatic.     Right Ear: Tympanic membrane and ear canal normal.     Left Ear: Tympanic membrane and ear canal normal.     Nose: Nose normal.     Right Sinus: No maxillary sinus tenderness.     Left Sinus: No maxillary sinus tenderness.     Mouth/Throat:     Mouth: Mucous membranes are moist.     Pharynx: Uvula midline.  Eyes:     General: No scleral icterus.       Right eye: No discharge.        Left eye: No discharge.     Conjunctiva/sclera: Conjunctivae normal.  Neck:     Musculoskeletal: Normal range of motion. No erythema.     Thyroid: No thyromegaly.  Vascular: No carotid bruit.  Cardiovascular:     Rate and Rhythm: Normal rate and regular rhythm.     Pulses: Normal pulses.     Heart sounds: Normal heart sounds. No murmur.  Pulmonary:     Effort: Pulmonary effort is normal. No respiratory distress.     Breath sounds: No wheezing.  Chest:     Breasts:        Right: No mass, nipple discharge, skin change or tenderness.        Left: No mass, nipple discharge, skin change or tenderness.  Abdominal:     General: Bowel sounds are normal.     Palpations: Abdomen is soft.     Tenderness: There is no abdominal tenderness.   Musculoskeletal: Normal range of motion.     Right lower leg: No edema.     Left lower leg: No edema.  Lymphadenopathy:     Cervical: No cervical adenopathy.  Skin:    General: Skin is warm and dry.     Findings: No rash.  Neurological:     Mental Status: She is alert and oriented to person, place, and time.     Cranial Nerves: No cranial nerve deficit.     Sensory: No sensory deficit.     Deep Tendon Reflexes: Reflexes are normal and symmetric.  Psychiatric:        Speech: Speech normal.        Behavior: Behavior normal.        Thought Content: Thought content normal.     Wt Readings from Last 3 Encounters:  10/12/18 160 lb (72.6 kg)  10/06/17 155 lb 9.6 oz (70.6 kg)  10/21/16 155 lb (70.3 kg)    BP 108/64   Pulse 79   Ht 5\' 7"  (1.702 m)   Wt 160 lb (72.6 kg) Comment: patient was holding purse and would not set down  LMP 10/05/2018 (Exact Date) Comment: 5 day periods  SpO2 97%   BMI 25.06 kg/m   Assessment and Plan: 1. Annual physical exam Normal exam Recommend regular exercise - Comprehensive metabolic panel - TSH - Hemoglobin A1c - POCT urinalysis dipstick  2. Encounter for screening mammogram for breast cancer Schedule at [facility]; Future  3. Hyperlipidemia, mixed Check labs - Lipid panel  4. Anemia, unspecified type Continue iron - CBC with Differential/Platelet - Iron, TIBC and Ferritin Panel   Partially dictated using Editor, commissioning. Any errors are unintentional.  Halina Maidens, MD [facility]  10/12/2018

## 2018-10-13 LAB — CBC WITH DIFFERENTIAL/PLATELET
Basophils Absolute: 0 10*3/uL (ref 0.0–0.2)
Basos: 1 %
EOS (ABSOLUTE): 0.2 10*3/uL (ref 0.0–0.4)
Eos: 3 %
Hematocrit: 34.6 % (ref 34.0–46.6)
Hemoglobin: 11.8 g/dL (ref 11.1–15.9)
Immature Grans (Abs): 0 10*3/uL (ref 0.0–0.1)
Immature Granulocytes: 0 %
Lymphocytes Absolute: 1.5 10*3/uL (ref 0.7–3.1)
Lymphs: 29 %
MCH: 32.5 pg (ref 26.6–33.0)
MCHC: 34.1 g/dL (ref 31.5–35.7)
MCV: 95 fL (ref 79–97)
Monocytes Absolute: 0.3 10*3/uL (ref 0.1–0.9)
Monocytes: 6 %
Neutrophils Absolute: 3.2 10*3/uL (ref 1.4–7.0)
Neutrophils: 61 %
Platelets: 275 10*3/uL (ref 150–450)
RBC: 3.63 x10E6/uL — ABNORMAL LOW (ref 3.77–5.28)
RDW: 12.4 % (ref 11.7–15.4)
WBC: 5.2 10*3/uL (ref 3.4–10.8)

## 2018-10-13 LAB — COMPREHENSIVE METABOLIC PANEL
ALT: 12 IU/L (ref 0–32)
AST: 14 IU/L (ref 0–40)
Albumin/Globulin Ratio: 1.5 (ref 1.2–2.2)
Albumin: 4 g/dL (ref 3.8–4.9)
Alkaline Phosphatase: 72 IU/L (ref 39–117)
BUN/Creatinine Ratio: 10 (ref 9–23)
BUN: 6 mg/dL (ref 6–24)
Bilirubin Total: 0.3 mg/dL (ref 0.0–1.2)
CO2: 24 mmol/L (ref 20–29)
Calcium: 8.8 mg/dL (ref 8.7–10.2)
Chloride: 106 mmol/L (ref 96–106)
Creatinine, Ser: 0.63 mg/dL (ref 0.57–1.00)
GFR calc Af Amer: 119 mL/min/{1.73_m2} (ref >59)
GFR calc non Af Amer: 103 mL/min/{1.73_m2} (ref >59)
Globulin, Total: 2.6 g/dL (ref 1.5–4.5)
Glucose: 99 mg/dL (ref 65–99)
Potassium: 4.1 mmol/L (ref 3.5–5.2)
Sodium: 141 mmol/L (ref 134–144)
Total Protein: 6.6 g/dL (ref 6.0–8.5)

## 2018-10-13 LAB — IRON,TIBC AND FERRITIN PANEL
Ferritin: 32 ng/mL (ref 15–150)
Iron Saturation: 24 % (ref 15–55)
Iron: 71 ug/dL (ref 27–159)
Total Iron Binding Capacity: 294 ug/dL (ref 250–450)
UIBC: 223 ug/dL (ref 131–425)

## 2018-10-13 LAB — TSH: TSH: 2.39 u[IU]/mL (ref 0.450–4.500)

## 2018-10-13 LAB — HEMOGLOBIN A1C
Est. average glucose Bld gHb Est-mCnc: 100 mg/dL
Hgb A1c MFr Bld: 5.1 % (ref 4.8–5.6)

## 2018-10-13 LAB — LIPID PANEL
Chol/HDL Ratio: 4.1 ratio (ref 0.0–4.4)
Cholesterol, Total: 222 mg/dL — ABNORMAL HIGH (ref 100–199)
HDL: 54 mg/dL (ref >39)
LDL Calculated: 147 mg/dL — ABNORMAL HIGH (ref 0–99)
Triglycerides: 104 mg/dL (ref 0–149)
VLDL Cholesterol Cal: 21 mg/dL (ref 5–40)

## 2019-01-10 IMAGING — MG DIGITAL SCREENING BILATERAL MAMMOGRAM WITH CAD
6 series · 6 of 6 positions shown · non-contrast
Comparison: Previous exam(s).

CLINICAL DATA: Screening.

EXAM:
DIGITAL SCREENING BILATERAL MAMMOGRAM WITH CAD

[L MLO]
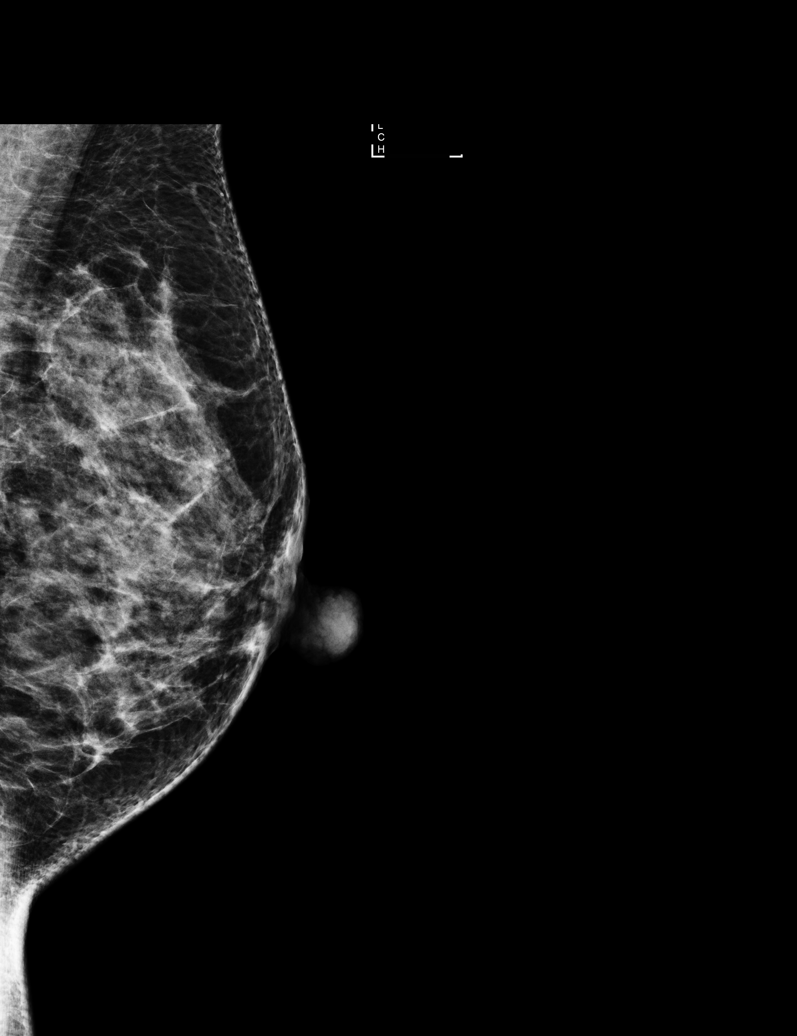

[R CC (1 of 2)]
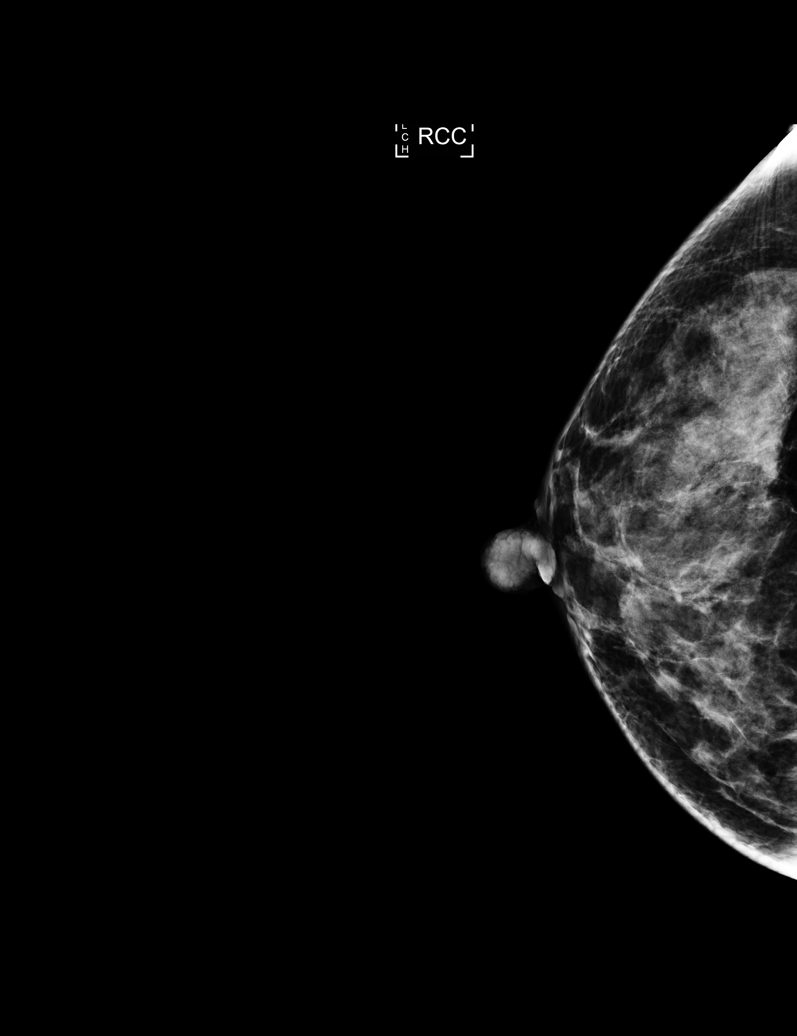

[R MLO (1 of 2)]
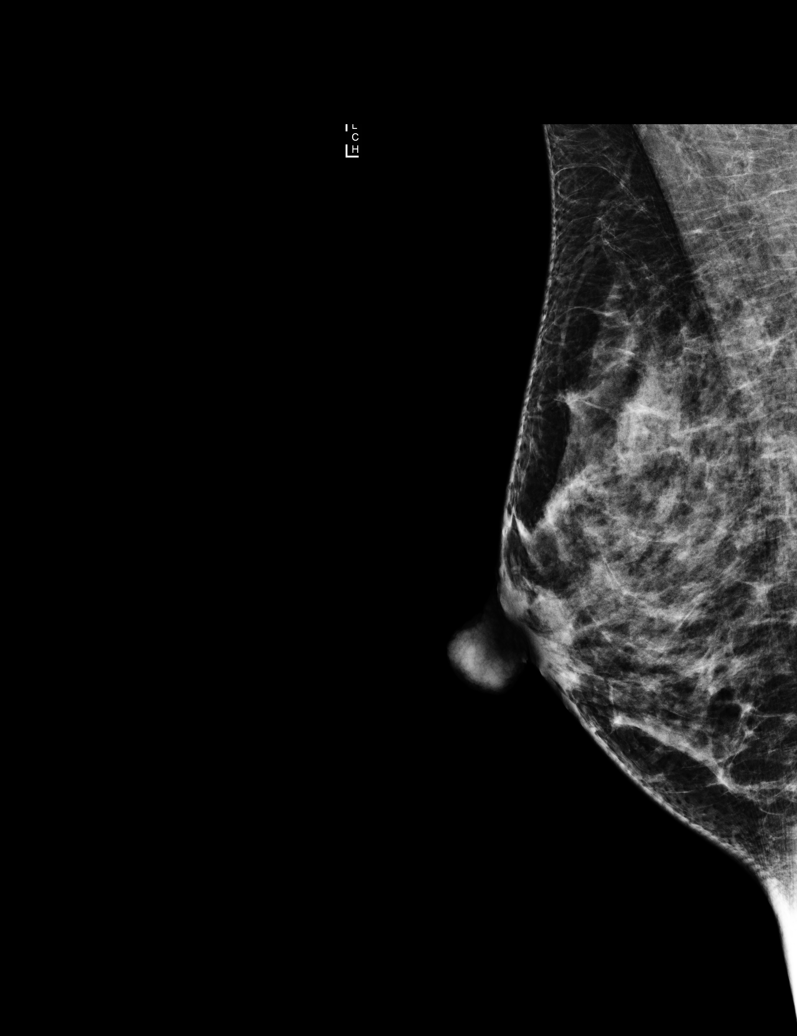

[L CC]
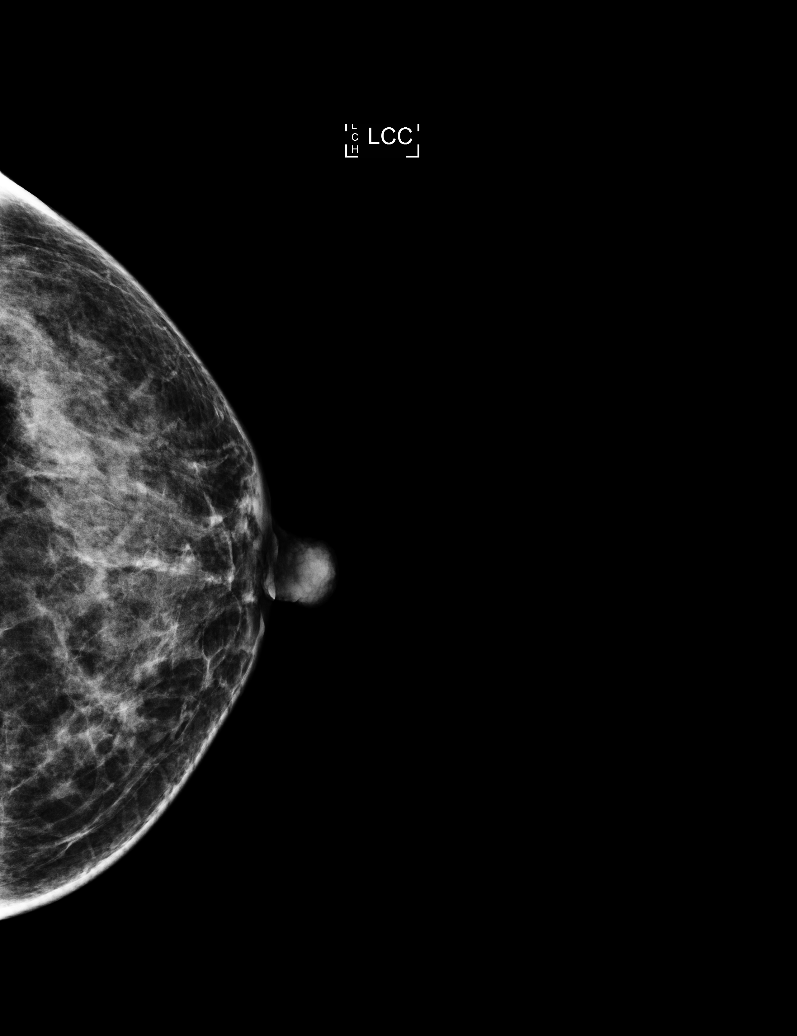

[R MLO (2 of 2)]
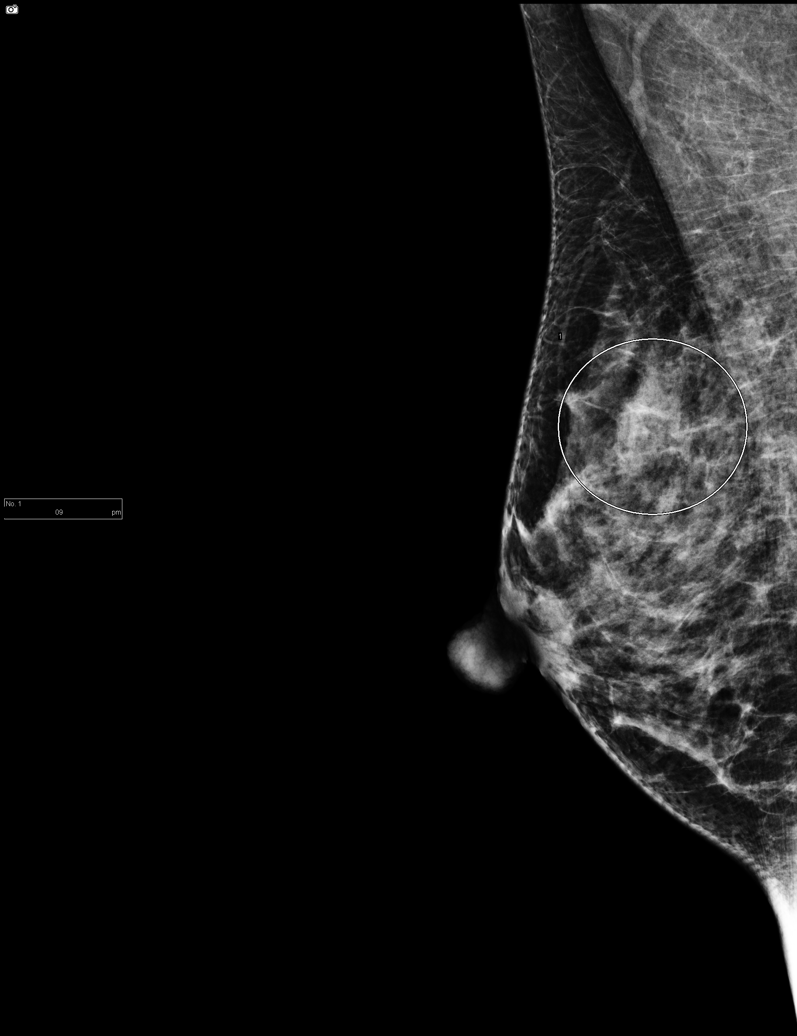

[R CC (2 of 2)]
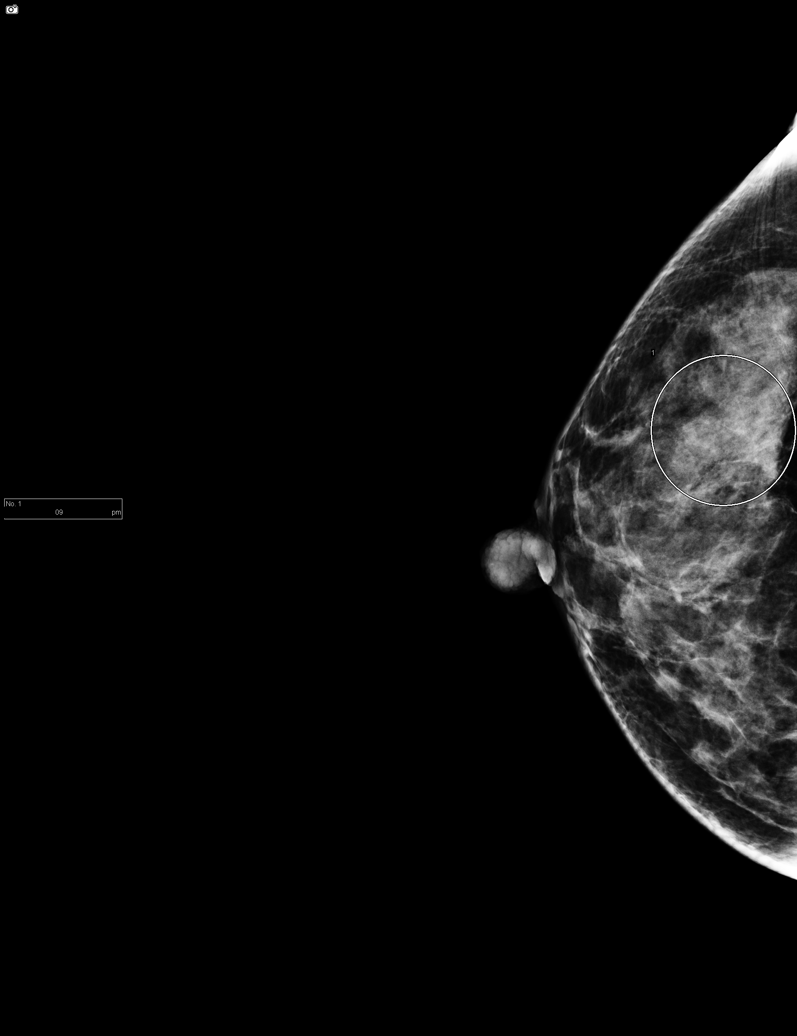

[6 of 6 positions shown; findings below may reference images not displayed]

ACR Breast Density Category c: The breast tissue is heterogeneously
dense, which may obscure small masses.
FINDINGS: In the right breast, a possible asymmetry warrants further
evaluation. In the left breast, no findings suspicious for
malignancy. Images were processed with CAD.
IMPRESSION: Further evaluation is suggested for possible asymmetry in the right
breast.

RECOMMENDATION:
Diagnostic mammogram and possibly ultrasound of the right breast.
(Code:[id number])

The patient will be contacted regarding the findings, and additional
imaging will be scheduled.

BI-RADS CATEGORY  0: Incomplete. Need additional imaging evaluation
and/or prior mammograms for comparison.

## 2021-01-30 ENCOUNTER — Other Ambulatory Visit: Payer: Self-pay

## 2021-01-30 ENCOUNTER — Encounter: Payer: Self-pay | Admitting: Internal Medicine

## 2021-01-30 ENCOUNTER — Ambulatory Visit (INDEPENDENT_AMBULATORY_CARE_PROVIDER_SITE_OTHER): Payer: No Typology Code available for payment source | Admitting: Internal Medicine

## 2021-01-30 VITALS — BP 118/66 | HR 73 | Temp 98.1°F | Ht 67.0 in | Wt 149.0 lb

## 2021-01-30 DIAGNOSIS — Z1159 Encounter for screening for other viral diseases: Secondary | ICD-10-CM

## 2021-01-30 DIAGNOSIS — E782 Mixed hyperlipidemia: Secondary | ICD-10-CM | POA: Diagnosis not present

## 2021-01-30 DIAGNOSIS — Z0000 Encounter for general adult medical examination without abnormal findings: Secondary | ICD-10-CM | POA: Diagnosis not present

## 2021-01-30 DIAGNOSIS — M722 Plantar fascial fibromatosis: Secondary | ICD-10-CM

## 2021-01-30 DIAGNOSIS — Z1231 Encounter for screening mammogram for malignant neoplasm of breast: Secondary | ICD-10-CM

## 2021-01-30 NOTE — Progress Notes (Signed)
Date:  01/30/2021   Name:  Brittany Wiley   DOB:  05-31-1965   MRN:  [id number]   Chief Complaint: Annual Exam (Breast exam no pap) Brittany Wiley is a 55 y.o. female who presents today for her Complete Annual Exam. She feels well. She reports exercising treadmill at home a few days a week. She reports she is sleeping well. Breast complaints none. She is still having regular menses.  Mammogram: 03/2018 DEXA: none Pap smear: 09/2016 neg with co-testing Colonoscopy: 09/2016 tubular adenoma repeat 2023   There is no immunization history on file for this patient.  HPI  Lab Results  Component Value Date   CREATININE 0.63 10/12/2018   BUN 6 10/12/2018   NA 141 10/12/2018   K 4.1 10/12/2018   CL 106 10/12/2018   CO2 24 10/12/2018   Lab Results  Component Value Date   CHOL 222 (H) 10/12/2018   HDL 54 10/12/2018   LDLCALC 147 (H) 10/12/2018   TRIG 104 10/12/2018   CHOLHDL 4.1 10/12/2018   Lab Results  Component Value Date   TSH 2.390 10/12/2018   Lab Results  Component Value Date   HGBA1C 5.1 10/12/2018   Lab Results  Component Value Date   WBC 5.2 10/12/2018   HGB 11.8 10/12/2018   HCT 34.6 10/12/2018   MCV 95 10/12/2018   PLT 275 10/12/2018   Lab Results  Component Value Date   ALT 12 10/12/2018   AST 14 10/12/2018   ALKPHOS 72 10/12/2018   BILITOT 0.3 10/12/2018     Review of Systems  Constitutional:  Negative for chills, fatigue and fever.  HENT:  Negative for congestion, hearing loss, tinnitus, trouble swallowing and voice change.   Eyes:  Negative for visual disturbance.  Respiratory:  Negative for cough, chest tightness, shortness of breath and wheezing.   Cardiovascular:  Negative for chest pain, palpitations and leg swelling.  Gastrointestinal:  Negative for abdominal pain, constipation, diarrhea and vomiting.  Endocrine: Negative for polydipsia and polyuria.  Genitourinary:  Negative for dysuria, frequency, genital sores, vaginal bleeding and vaginal  discharge.  Musculoskeletal:  Positive for arthralgias (foot pain - esp in the am). Negative for gait problem and joint swelling.  Skin:  Negative for color change and rash.  Neurological:  Negative for dizziness, tremors, light-headedness and headaches.  Hematological:  Negative for adenopathy. Does not bruise/bleed easily.  Psychiatric/Behavioral:  Negative for dysphoric mood and sleep disturbance. The patient is not nervous/anxious.    Patient Active Problem List   Diagnosis Date Noted   Screen for colon cancer    Polyp of sigmoid colon    Hyperlipidemia, mixed 09/22/2016   Family history of diabetes mellitus 04/20/2015    No Known Allergies  Past Surgical History:  Procedure Laterality Date   CESAREAN SECTION     COLONOSCOPY WITH PROPOFOL N/A 10/21/2016   Procedure: COLONOSCOPY WITH PROPOFOL;  Surgeon: Lucilla Lame, MD;  Location: [facility] ENDOSCOPY;  Service: Endoscopy;  Laterality: N/A;    Social History   Tobacco Use   Smoking status: Never   Smokeless tobacco: Never  Vaping Use   Vaping Use: Never used  Substance Use Topics   Alcohol use: Yes    Alcohol/week: 0.0 standard drinks    Comment: rarely   Drug use: No     Medication list has been reviewed and updated.  Current Meds  Medication Sig   [DISCONTINUED] ferrous sulfate 325 (65 FE) MG EC tablet Take 325 mg by  mouth 3 (three) times daily with meals.    PHQ 2/9 Scores 01/30/2021 10/12/2018 10/06/2017  PHQ - 2 Score 0 0 0  PHQ- 9 Score 2 - -    GAD 7 : Generalized Anxiety Score 01/30/2021  Nervous, Anxious, on Edge 0  Control/stop worrying 0  Worry too much - different things 0  Trouble relaxing 0  Restless 0  Easily annoyed or irritable 0  Afraid - awful might happen 0  Total GAD 7 Score 0  Anxiety Difficulty Not difficult at all    BP Readings from Last 3 Encounters:  01/30/21 118/66  10/12/18 108/64  10/06/17 103/68    Physical Exam Vitals and nursing note reviewed.  Constitutional:       General: She is not in acute distress.    Appearance: She is well-developed.  HENT:     Head: Normocephalic and atraumatic.     Right Ear: Tympanic membrane and ear canal normal.     Left Ear: Tympanic membrane and ear canal normal.     Nose:     Right Sinus: No maxillary sinus tenderness.     Left Sinus: No maxillary sinus tenderness.  Eyes:     General: No scleral icterus.       Right eye: No discharge.        Left eye: No discharge.     Conjunctiva/sclera: Conjunctivae normal.  Neck:     Thyroid: No thyromegaly.     Vascular: No carotid bruit.  Cardiovascular:     Rate and Rhythm: Normal rate and regular rhythm.     Pulses: Normal pulses.     Heart sounds: Normal heart sounds.  Pulmonary:     Effort: Pulmonary effort is normal. No respiratory distress.     Breath sounds: No wheezing.  Chest:  Breasts:    Right: No mass, nipple discharge, skin change or tenderness.     Left: No mass, nipple discharge, skin change or tenderness.  Abdominal:     General: Bowel sounds are normal.     Palpations: Abdomen is soft.     Tenderness: There is no abdominal tenderness.  Musculoskeletal:     Cervical back: Normal range of motion. No erythema.     Right lower leg: No edema.     Left lower leg: No edema.     Right foot: Normal.     Left foot: Normal.     Comments: Area of tenderness at the heel  Lymphadenopathy:     Cervical: No cervical adenopathy.  Skin:    General: Skin is warm and dry.     Capillary Refill: Capillary refill takes less than 2 seconds.     Findings: No rash.  Neurological:     General: No focal deficit present.     Mental Status: She is alert and oriented to person, place, and time.     Cranial Nerves: No cranial nerve deficit.     Sensory: No sensory deficit.     Deep Tendon Reflexes: Reflexes are normal and symmetric.  Psychiatric:        Attention and Perception: Attention normal.        Mood and Affect: Mood normal.        Behavior: Behavior normal.     Wt Readings from Last 3 Encounters:  01/30/21 149 lb (67.6 kg)  10/12/18 160 lb (72.6 kg)  10/06/17 155 lb 9.6 oz (70.6 kg)    BP 118/66   Pulse 73   Temp 98.1  F (36.7 C) (Oral)   Ht '5\' 7"'$  (1.702 m)   Wt 149 lb (67.6 kg)   LMP 01/07/2021 (Approximate)   SpO2 97%   BMI 23.34 kg/m   Assessment and Plan: 1. Annual physical exam Normal exam Pt gets flu vaccine at work She declines Shingrix Pap and colonoscopy are due next year - CBC with Differential/Platelet - Comprehensive metabolic panel - TSH  2. Encounter for screening mammogram for breast cancer Schedule at [place]  3. Hyperlipidemia, mixed - Lipid panel  4. Need for hepatitis C screening test - Hepatitis C antibody  5. Plantar fasciitis, bilateral Recommend shoe inserts for work; tylenol or advil if needed Follow up if worsening   Partially dictated using Editor, commissioning. Any errors are unintentional.  Halina Maidens, MD [facility]  01/30/2021

## 2021-01-31 LAB — LIPID PANEL
Chol/HDL Ratio: 4.3 ratio (ref 0.0–4.4)
Cholesterol, Total: 209 mg/dL — ABNORMAL HIGH (ref 100–199)
HDL: 49 mg/dL (ref >39)
LDL Chol Calc (NIH): 136 mg/dL — ABNORMAL HIGH (ref 0–99)
Triglycerides: 135 mg/dL (ref 0–149)
VLDL Cholesterol Cal: 24 mg/dL (ref 5–40)

## 2021-01-31 LAB — TSH: TSH: 2.37 u[IU]/mL (ref 0.450–4.500)

## 2021-01-31 LAB — HEPATITIS C ANTIBODY: Hep C Virus Ab: <0.1 s/co ratio (ref 0.0–0.9)

## 2021-01-31 LAB — COMPREHENSIVE METABOLIC PANEL
ALT: 10 IU/L (ref 0–32)
AST: 13 IU/L (ref 0–40)
Albumin/Globulin Ratio: 1.6 (ref 1.2–2.2)
Albumin: 4.4 g/dL (ref 3.8–4.9)
Alkaline Phosphatase: 71 IU/L (ref 44–121)
BUN/Creatinine Ratio: 8 — ABNORMAL LOW (ref 9–23)
BUN: 6 mg/dL (ref 6–24)
Bilirubin Total: <0.2 mg/dL (ref 0.0–1.2)
CO2: 22 mmol/L (ref 20–29)
Calcium: 8.8 mg/dL (ref 8.7–10.2)
Chloride: 104 mmol/L (ref 96–106)
Creatinine, Ser: 0.73 mg/dL (ref 0.57–1.00)
Globulin, Total: 2.8 g/dL (ref 1.5–4.5)
Glucose: 96 mg/dL (ref 65–99)
Potassium: 4 mmol/L (ref 3.5–5.2)
Sodium: 139 mmol/L (ref 134–144)
Total Protein: 7.2 g/dL (ref 6.0–8.5)
eGFR: 97 mL/min/{1.73_m2} (ref >59)

## 2021-01-31 LAB — CBC WITH DIFFERENTIAL/PLATELET
Basophils Absolute: 0.1 10*3/uL (ref 0.0–0.2)
Basos: 2 %
EOS (ABSOLUTE): 0.3 10*3/uL (ref 0.0–0.4)
Eos: 6 %
Hematocrit: 28.3 % — ABNORMAL LOW (ref 34.0–46.6)
Hemoglobin: 8.3 g/dL — ABNORMAL LOW (ref 11.1–15.9)
Immature Grans (Abs): 0 10*3/uL (ref 0.0–0.1)
Immature Granulocytes: 0 %
Lymphocytes Absolute: 1.7 10*3/uL (ref 0.7–3.1)
Lymphs: 41 %
MCH: 20.8 pg — ABNORMAL LOW (ref 26.6–33.0)
MCHC: 29.3 g/dL — ABNORMAL LOW (ref 31.5–35.7)
MCV: 71 fL — ABNORMAL LOW (ref 79–97)
Monocytes Absolute: 0.3 10*3/uL (ref 0.1–0.9)
Monocytes: 8 %
Neutrophils Absolute: 1.7 10*3/uL (ref 1.4–7.0)
Neutrophils: 43 %
Platelets: 323 10*3/uL (ref 150–450)
RBC: 3.99 x10E6/uL (ref 3.77–5.28)
RDW: 17.7 % — ABNORMAL HIGH (ref 11.7–15.4)
WBC: 4 10*3/uL (ref 3.4–10.8)

## 2021-02-28 ENCOUNTER — Other Ambulatory Visit: Payer: Self-pay

## 2021-02-28 ENCOUNTER — Ambulatory Visit
Admission: RE | Admit: 2021-02-28 | Discharge: 2021-02-28 | Disposition: A | Discharge status: Home / Self Care | Payer: No Typology Code available for payment source | Source: Ambulatory Visit | Attending: Internal Medicine | Admitting: Internal Medicine

## 2021-02-28 DIAGNOSIS — Z1231 Encounter for screening mammogram for malignant neoplasm of breast: Secondary | ICD-10-CM | POA: Diagnosis not present

## 2021-04-29 ENCOUNTER — Other Ambulatory Visit: Payer: Self-pay

## 2021-04-29 ENCOUNTER — Encounter: Payer: Self-pay | Admitting: Internal Medicine

## 2021-04-29 ENCOUNTER — Ambulatory Visit (INDEPENDENT_AMBULATORY_CARE_PROVIDER_SITE_OTHER): Payer: No Typology Code available for payment source | Admitting: Internal Medicine

## 2021-04-29 VITALS — BP 124/74 | HR 80 | Temp 98.7°F | Ht 67.0 in | Wt 157.0 lb

## 2021-04-29 DIAGNOSIS — J358 Other chronic diseases of tonsils and adenoids: Secondary | ICD-10-CM

## 2021-04-29 DIAGNOSIS — D509 Iron deficiency anemia, unspecified: Secondary | ICD-10-CM

## 2021-04-29 NOTE — Progress Notes (Signed)
Date:  04/29/2021   Name:  Brittany Brittany   DOB:  [birth year]   MRN:  [id number]   Chief Complaint: Tonsil stone (X2 weeks, not symptoms, can visually see something in back of throat )  HPI Tonsil stone - she noticed white spots on each tonsil last week.  No pain, sore throat or fever.  She waited a week and then made this appointment when there was no change.  Anemia - anemia noted last visit with hx of iron deficiency.  She is now taking iron every other day with no side effects.  Lab Results  Component Value Date   NA 139 01/30/2021   K 4.0 01/30/2021   CO2 22 01/30/2021   GLUCOSE 96 01/30/2021   BUN 6 01/30/2021   CREATININE 0.73 01/30/2021   CALCIUM 8.8 01/30/2021   EGFR 97 01/30/2021   GFRNONAA 103 10/12/2018   Lab Results  Component Value Date   CHOL 209 (H) 01/30/2021   HDL 49 01/30/2021   LDLCALC 136 (H) 01/30/2021   TRIG 135 01/30/2021   CHOLHDL 4.3 01/30/2021   Lab Results  Component Value Date   TSH 2.370 01/30/2021   Lab Results  Component Value Date   HGBA1C 5.1 10/12/2018   Lab Results  Component Value Date   WBC 4.0 01/30/2021   HGB 8.3 (L) 01/30/2021   HCT 28.3 (L) 01/30/2021   MCV 71 (L) 01/30/2021   PLT 323 01/30/2021   Lab Results  Component Value Date   ALT 10 01/30/2021   AST 13 01/30/2021   ALKPHOS 71 01/30/2021   BILITOT <0.2 01/30/2021   No results found for: 25OHVITD2, 25OHVITD3, VD25OH   Review of Systems  Constitutional:  Negative for chills, fatigue and fever.  HENT:  Negative for sore throat and trouble swallowing.   Respiratory:  Negative for shortness of breath.   Cardiovascular:  Negative for chest pain.  Musculoskeletal:  Negative for arthralgias.  Psychiatric/Behavioral:  Negative for dysphoric mood and sleep disturbance.    Patient Active Problem List   Diagnosis Date Noted   Anemia, iron deficiency 04/29/2021   Screen for colon cancer    Polyp of sigmoid colon    Hyperlipidemia, mixed 09/22/2016   Family  history of diabetes mellitus 04/20/2015    No Known Allergies  Past Surgical History:  Procedure Laterality Date   CESAREAN SECTION     COLONOSCOPY WITH PROPOFOL N/A 10/21/2016   Procedure: COLONOSCOPY WITH PROPOFOL;  Surgeon: Lucilla Lame, MD;  Location: [facility] ENDOSCOPY;  Service: Endoscopy;  Laterality: N/A;    Social History   Tobacco Use   Smoking status: Never   Smokeless tobacco: Never  Vaping Use   Vaping Use: Never used  Substance Use Topics   Alcohol use: Yes    Alcohol/week: 0.0 standard drinks    Comment: rarely   Drug use: No     Medication list has been reviewed and updated.  Current Meds  Medication Sig   ferrous sulfate 325 (65 FE) MG EC tablet Take 325 mg by mouth daily with breakfast.    PHQ 2/9 Scores 04/29/2021 01/30/2021 10/12/2018 10/06/2017  PHQ - 2 Score 1 0 0 0  PHQ- 9 Score 3 2 - -    GAD 7 : Generalized Anxiety Score 04/29/2021 01/30/2021  Nervous, Anxious, on Edge 0 0  Control/stop worrying 0 0  Worry too much - different things 0 0  Trouble relaxing 0 0  Restless 0 0  Easily annoyed or  irritable 0 0  Afraid - awful might happen 0 0  Total GAD 7 Score 0 0  Anxiety Difficulty - Not difficult at all    BP Readings from Last 3 Encounters:  04/29/21 124/74  01/30/21 118/66  10/12/18 108/64    Physical Exam Vitals and nursing note reviewed.  Constitutional:      General: She is not in acute distress.    Appearance: Normal appearance. She is well-developed.  HENT:     Head: Normocephalic and atraumatic.     Mouth/Throat:     Lips: Pink.     Mouth: Mucous membranes are moist.     Pharynx: Oropharynx is clear. No posterior oropharyngeal erythema.     Tonsils: No tonsillar exudate or tonsillar abscesses. 1+ on the right. 1+ on the left.  Eyes:     General: Lids are normal.     Extraocular Movements: Extraocular movements intact.     Conjunctiva/sclera: Conjunctivae normal.  Cardiovascular:     Rate and Rhythm: Normal rate and regular  rhythm.     Pulses: Normal pulses.  Pulmonary:     Effort: Pulmonary effort is normal. No respiratory distress.     Breath sounds: No wheezing or rhonchi.  Skin:    General: Skin is warm and dry.     Findings: No rash.  Neurological:     Mental Status: She is alert and oriented to person, place, and time.  Psychiatric:        Mood and Affect: Mood normal.        Behavior: Behavior normal.    Wt Readings from Last 3 Encounters:  04/29/21 157 lb (71.2 kg)  01/30/21 149 lb (67.6 kg)  10/12/18 160 lb (72.6 kg)    BP 124/74   Pulse 80   Temp 98.7 F (37.1 C) (Oral)   Ht 5' 7"  (1.702 m)   Wt 157 lb (71.2 kg)   SpO2 98%   BMI 24.59 kg/m   Assessment and Plan: 1. Tonsillith Apparently resolved spontaneously Pt advised to monitor - gargle if recurrent but no specific treatment is needed, esp if asx.  2. Iron deficiency anemia, unspecified iron deficiency anemia type Continue iron supplement every other day indefinitely. Check labs to verify improvement - CBC with Differential/Platelet - Iron, TIBC and Ferritin Panel   Partially dictated using Editor, commissioning. Any errors are unintentional.  Halina Maidens, MD [facility]  04/29/2021

## 2021-04-30 LAB — CBC WITH DIFFERENTIAL/PLATELET
Basophils Absolute: 0.1 10*3/uL (ref 0.0–0.2)
Basos: 2 %
EOS (ABSOLUTE): 0.4 10*3/uL (ref 0.0–0.4)
Eos: 8 %
Hematocrit: 39.1 % (ref 34.0–46.6)
Hemoglobin: 12.9 g/dL (ref 11.1–15.9)
Immature Grans (Abs): 0 10*3/uL (ref 0.0–0.1)
Immature Granulocytes: 0 %
Lymphocytes Absolute: 1.5 10*3/uL (ref 0.7–3.1)
Lymphs: 33 %
MCH: 28.7 pg (ref 26.6–33.0)
MCHC: 33 g/dL (ref 31.5–35.7)
MCV: 87 fL (ref 79–97)
Monocytes Absolute: 0.3 10*3/uL (ref 0.1–0.9)
Monocytes: 7 %
Neutrophils Absolute: 2.3 10*3/uL (ref 1.4–7.0)
Neutrophils: 50 %
Platelets: 284 10*3/uL (ref 150–450)
RBC: 4.49 x10E6/uL (ref 3.77–5.28)
RDW: 20.7 % — ABNORMAL HIGH (ref 11.7–15.4)
WBC: 4.5 10*3/uL (ref 3.4–10.8)

## 2021-04-30 LAB — IRON,TIBC AND FERRITIN PANEL
Ferritin: 31 ng/mL (ref 15–150)
Iron Saturation: 16 % (ref 15–55)
Iron: 56 ug/dL (ref 27–159)
Total Iron Binding Capacity: 341 ug/dL (ref 250–450)
UIBC: 285 ug/dL (ref 131–425)

## 2022-01-30 ENCOUNTER — Ambulatory Visit (INDEPENDENT_AMBULATORY_CARE_PROVIDER_SITE_OTHER): Payer: No Typology Code available for payment source | Admitting: Internal Medicine

## 2022-01-30 ENCOUNTER — Encounter: Payer: Self-pay | Admitting: Internal Medicine

## 2022-01-30 ENCOUNTER — Other Ambulatory Visit (HOSPITAL_COMMUNITY)
Admission: RE | Admit: 2022-01-30 | Discharge: 2022-01-30 | Disposition: A | Discharge status: Home / Self Care | Payer: No Typology Code available for payment source | Source: Ambulatory Visit | Attending: Internal Medicine | Admitting: Internal Medicine

## 2022-01-30 VITALS — BP 120/68 | HR 70 | Ht 67.0 in | Wt 155.4 lb

## 2022-01-30 DIAGNOSIS — Z0000 Encounter for general adult medical examination without abnormal findings: Secondary | ICD-10-CM

## 2022-01-30 DIAGNOSIS — Z23 Encounter for immunization: Secondary | ICD-10-CM | POA: Diagnosis not present

## 2022-01-30 DIAGNOSIS — E782 Mixed hyperlipidemia: Secondary | ICD-10-CM

## 2022-01-30 DIAGNOSIS — Z124 Encounter for screening for malignant neoplasm of cervix: Secondary | ICD-10-CM | POA: Diagnosis not present

## 2022-01-30 DIAGNOSIS — Z1211 Encounter for screening for malignant neoplasm of colon: Secondary | ICD-10-CM

## 2022-01-30 DIAGNOSIS — D259 Leiomyoma of uterus, unspecified: Secondary | ICD-10-CM | POA: Diagnosis not present

## 2022-01-30 DIAGNOSIS — Z1231 Encounter for screening mammogram for malignant neoplasm of breast: Secondary | ICD-10-CM | POA: Diagnosis not present

## 2022-01-30 DIAGNOSIS — M722 Plantar fascial fibromatosis: Secondary | ICD-10-CM | POA: Diagnosis not present

## 2022-01-30 DIAGNOSIS — D509 Iron deficiency anemia, unspecified: Secondary | ICD-10-CM

## 2022-01-30 NOTE — Progress Notes (Signed)
Date:  01/30/2022   Name:  Brittany Wiley   DOB:  1966/02/12   MRN:  [id number]   Chief Complaint: Annual Exam Brittany Wiley is a 56 y.o. female who presents today for her Complete Annual Exam. She feels fairly well. She reports exercising. She reports she is sleeping poorly. Breast complaints - none.  Mammogram: 02/2021 DEXA: none Pap smear: 09/2016 due Colonoscopy: 09/2016 repeat 5 yrs  - due Dr. Allen Norris  Health Maintenance Due  Topic Date Due  . PAP SMEAR-Modifier  09/23/2021  . COLONOSCOPY (Pts 45-48yr Insurance coverage will need to be confirmed)  10/21/2021    Immunization History  Administered Date(s) Administered  . Influenza-Unspecified 01/24/2021  . Tdap 01/30/2022  . Zoster Recombinat (Shingrix) 01/30/2022    Foot Injury  There was no injury mechanism. The pain is present in the right foot (pain on sole near heel). The quality of the pain is described as burning and shooting. The pain is moderate. Associated symptoms include an inability to bear weight. She has tried nothing for the symptoms.    Lab Results  Component Value Date   NA 139 01/30/2021   K 4.0 01/30/2021   CO2 22 01/30/2021   GLUCOSE 96 01/30/2021   BUN 6 01/30/2021   CREATININE 0.73 01/30/2021   CALCIUM 8.8 01/30/2021   EGFR 97 01/30/2021   GFRNONAA 103 10/12/2018   Lab Results  Component Value Date   CHOL 209 (H) 01/30/2021   HDL 49 01/30/2021   LDLCALC 136 (H) 01/30/2021   TRIG 135 01/30/2021   CHOLHDL 4.3 01/30/2021   Lab Results  Component Value Date   TSH 2.370 01/30/2021   Lab Results  Component Value Date   HGBA1C 5.1 10/12/2018   Lab Results  Component Value Date   WBC 4.5 04/29/2021   HGB 12.9 04/29/2021   HCT 39.1 04/29/2021   MCV 87 04/29/2021   PLT 284 04/29/2021   Lab Results  Component Value Date   ALT 10 01/30/2021   AST 13 01/30/2021   ALKPHOS 71 01/30/2021   BILITOT <0.2 01/30/2021   No results found for: "25OHVITD2", "25OHVITD3", "VD25OH"   Review of  Systems  Constitutional:  Negative for chills, fatigue and fever.  HENT:  Negative for congestion, hearing loss, tinnitus, trouble swallowing and voice change.   Eyes:  Negative for visual disturbance.  Respiratory:  Negative for cough, chest tightness, shortness of breath and wheezing.   Cardiovascular:  Negative for chest pain, palpitations and leg swelling.  Gastrointestinal:  Negative for abdominal pain, constipation, diarrhea and vomiting.  Endocrine: Negative for polydipsia and polyuria.  Genitourinary:  Negative for dysuria, frequency, genital sores, vaginal bleeding and vaginal discharge.  Musculoskeletal:  Positive for myalgias (heel pain). Negative for arthralgias, gait problem and joint swelling.  Skin:  Negative for color change and rash.  Neurological:  Negative for dizziness, tremors, light-headedness and headaches.  Hematological:  Negative for adenopathy. Does not bruise/bleed easily.  Psychiatric/Behavioral:  Negative for dysphoric mood and sleep disturbance. The patient is not nervous/anxious.     Patient Active Problem List   Diagnosis Date Noted  . Uterine leiomyoma 01/30/2022  . Plantar fasciitis, bilateral 01/30/2022  . Anemia, iron deficiency 04/29/2021  . Screen for colon cancer   . Polyp of sigmoid colon   . Hyperlipidemia, mixed 09/22/2016  . Family history of diabetes mellitus 04/20/2015    No Known Allergies  Past Surgical History:  Procedure Laterality Date  . CESAREAN SECTION    .  COLONOSCOPY WITH PROPOFOL N/A 10/21/2016   Procedure: COLONOSCOPY WITH PROPOFOL;  Surgeon: Lucilla Lame, MD;  Location: [facility] ENDOSCOPY;  Service: Endoscopy;  Laterality: N/A;    Social History   Tobacco Use  . Smoking status: Never  . Smokeless tobacco: Never  Vaping Use  . Vaping Use: Never used  Substance Use Topics  . Alcohol use: Yes    Alcohol/week: 0.0 standard drinks of alcohol    Comment: rarely  . Drug use: No     Medication list has been reviewed and  updated.  Current Meds  Medication Sig  . ferrous sulfate 325 (65 FE) MG EC tablet Take 325 mg by mouth daily with breakfast.       01/30/2022    9:46 AM 04/29/2021    9:32 AM 01/30/2021    9:36 AM  GAD 7 : Generalized Anxiety Score  Nervous, Anxious, on Edge 0 0 0  Control/stop worrying 0 0 0  Worry too much - different things 0 0 0  Trouble relaxing 0 0 0  Restless 0 0 0  Easily annoyed or irritable 0 0 0  Afraid - awful might happen 0 0 0  Total GAD 7 Score 0 0 0  Anxiety Difficulty Not difficult at all  Not difficult at all       01/30/2022    9:45 AM 04/29/2021    9:32 AM 01/30/2021    9:36 AM  Depression screen PHQ 2/9  Decreased Interest 0 0 0  Down, Depressed, Hopeless 0 1 0  PHQ - 2 Score 0 1 0  Altered sleeping 0 0 1  Tired, decreased energy 0 1 1  Change in appetite 0 0 0  Feeling bad or failure about yourself  0 0 0  Trouble concentrating 0 1 0  Moving slowly or fidgety/restless 0 0 0  Suicidal thoughts 0 0 0  PHQ-9 Score 0 3 2  Difficult doing work/chores Not difficult at all Not difficult at all Not difficult at all    BP Readings from Last 3 Encounters:  01/30/22 120/68  04/29/21 124/74  01/30/21 118/66    Physical Exam Vitals and nursing note reviewed.  Constitutional:      General: She is not in acute distress.    Appearance: She is well-developed.  HENT:     Head: Normocephalic and atraumatic.     Right Ear: Tympanic membrane and ear canal normal.     Left Ear: Tympanic membrane and ear canal normal.     Nose:     Right Sinus: No maxillary sinus tenderness.     Left Sinus: No maxillary sinus tenderness.  Eyes:     General: No scleral icterus.       Right eye: No discharge.        Left eye: No discharge.     Conjunctiva/sclera: Conjunctivae normal.  Neck:     Thyroid: No thyromegaly.     Vascular: No carotid bruit.  Cardiovascular:     Rate and Rhythm: Normal rate and regular rhythm.     Pulses: Normal pulses.     Heart sounds: Normal  heart sounds.  Pulmonary:     Effort: Pulmonary effort is normal. No respiratory distress.     Breath sounds: No wheezing.  Chest:  Breasts:    Right: No mass, nipple discharge, skin change or tenderness.     Left: No mass, nipple discharge, skin change or tenderness.  Abdominal:     General: Bowel sounds are normal.  Palpations: Abdomen is soft.     Tenderness: There is no abdominal tenderness.  Genitourinary:    Labia:        Right: No tenderness, lesion or injury.        Left: No tenderness, lesion or injury.      Vagina: Normal.     Cervix: Normal.     Uterus: Normal.      Adnexa: Right adnexa normal and left adnexa normal.     Comments: Pap obtained Musculoskeletal:     Cervical back: Normal range of motion. No erythema.     Right lower leg: No edema.     Left lower leg: No edema.     Right foot: Tenderness (over plantar fascial insertion at the heel) present.  Lymphadenopathy:     Cervical: No cervical adenopathy.  Skin:    General: Skin is warm and dry.     Findings: No rash.  Neurological:     Mental Status: She is alert and oriented to person, place, and time.     Cranial Nerves: No cranial nerve deficit.     Sensory: No sensory deficit.     Deep Tendon Reflexes: Reflexes are normal and symmetric.  Psychiatric:        Attention and Perception: Attention normal.        Mood and Affect: Mood normal.    Wt Readings from Last 3 Encounters:  01/30/22 155 lb 6.4 oz (70.5 kg)  04/29/21 157 lb (71.2 kg)  01/30/21 149 lb (67.6 kg)    BP 120/68   Pulse 70   Ht 5' 7"  (1.702 m)   Wt 155 lb 6.4 oz (70.5 kg)   SpO2 97%   BMI 24.34 kg/m   Assessment and Plan: 1. Annual physical exam Normal exam Continue healthy diet, exercise - CBC with Differential/Platelet - Comprehensive metabolic panel - Cytology - PAP - Hemoglobin A1c - Lipid panel - TSH  2. Encounter for screening mammogram for breast cancer Schedule at [facility] - MM 3D SCREEN BREAST BILATERAL  3.  Colon cancer screening Due for 5 yr colonoscopy with Dr. Allen Norris - Ambulatory referral to Gastroenterology  4. Encounter for screening for cervical cancer Obtained today - Cytology - PAP  5. Hyperlipidemia, mixed Check labs and advise - Lipid panel - TSH  6. Iron deficiency anemia, unspecified iron deficiency anemia type - CBC with Differential/Platelet  7. Need for shingles vaccine First dose today  8. Plantar fasciitis, bilateral Recommend Advil, stretches and ice massage Handout given  9. Need for diphtheria-tetanus-pertussis (Tdap) vaccine - Tdap vaccine greater than or equal to 7yo IM  10. Uterine leiomyoma, unspecified location Seen on [place] in [place] No tenderness noted on exam today. Patient reassured regarding the benign nature    Partially dictated using Editor, commissioning. Any errors are unintentional.  Halina Maidens, MD [facility]  01/30/2022

## 2022-01-31 ENCOUNTER — Other Ambulatory Visit: Payer: Self-pay

## 2022-01-31 ENCOUNTER — Telehealth: Payer: Self-pay

## 2022-01-31 DIAGNOSIS — Z1211 Encounter for screening for malignant neoplasm of colon: Secondary | ICD-10-CM

## 2022-01-31 LAB — COMPREHENSIVE METABOLIC PANEL
ALT: 12 IU/L (ref 0–32)
AST: 20 IU/L (ref 0–40)
Albumin/Globulin Ratio: 1.5 (ref 1.2–2.2)
Albumin: 4.1 g/dL (ref 3.8–4.9)
Alkaline Phosphatase: 84 IU/L (ref 44–121)
BUN/Creatinine Ratio: 9 (ref 9–23)
BUN: 7 mg/dL (ref 6–24)
Bilirubin Total: 0.5 mg/dL (ref 0.0–1.2)
CO2: 23 mmol/L (ref 20–29)
Calcium: 9.2 mg/dL (ref 8.7–10.2)
Chloride: 103 mmol/L (ref 96–106)
Creatinine, Ser: 0.74 mg/dL (ref 0.57–1.00)
Globulin, Total: 2.8 g/dL (ref 1.5–4.5)
Glucose: 101 mg/dL — ABNORMAL HIGH (ref 70–99)
Potassium: 4.1 mmol/L (ref 3.5–5.2)
Sodium: 140 mmol/L (ref 134–144)
Total Protein: 6.9 g/dL (ref 6.0–8.5)
eGFR: 95 mL/min/{1.73_m2} (ref >59)

## 2022-01-31 LAB — CBC WITH DIFFERENTIAL/PLATELET
Basophils Absolute: 0.1 10*3/uL (ref 0.0–0.2)
Basos: 1 %
EOS (ABSOLUTE): 0.2 10*3/uL (ref 0.0–0.4)
Eos: 5 %
Hematocrit: 37.9 % (ref 34.0–46.6)
Hemoglobin: 12.9 g/dL (ref 11.1–15.9)
Immature Grans (Abs): 0 10*3/uL (ref 0.0–0.1)
Immature Granulocytes: 0 %
Lymphocytes Absolute: 1.9 10*3/uL (ref 0.7–3.1)
Lymphs: 40 %
MCH: 31.1 pg (ref 26.6–33.0)
MCHC: 34 g/dL (ref 31.5–35.7)
MCV: 91 fL (ref 79–97)
Monocytes Absolute: 0.3 10*3/uL (ref 0.1–0.9)
Monocytes: 7 %
Neutrophils Absolute: 2.2 10*3/uL (ref 1.4–7.0)
Neutrophils: 47 %
Platelets: 257 10*3/uL (ref 150–450)
RBC: 4.15 x10E6/uL (ref 3.77–5.28)
RDW: 13.2 % (ref 11.7–15.4)
WBC: 4.7 10*3/uL (ref 3.4–10.8)

## 2022-01-31 LAB — LIPID PANEL
Chol/HDL Ratio: 4.6 ratio — ABNORMAL HIGH (ref 0.0–4.4)
Cholesterol, Total: 263 mg/dL — ABNORMAL HIGH (ref 100–199)
HDL: 57 mg/dL (ref >39)
LDL Chol Calc (NIH): 185 mg/dL — ABNORMAL HIGH (ref 0–99)
Triglycerides: 116 mg/dL (ref 0–149)
VLDL Cholesterol Cal: 21 mg/dL (ref 5–40)

## 2022-01-31 LAB — HEMOGLOBIN A1C
Est. average glucose Bld gHb Est-mCnc: 117 mg/dL
Hgb A1c MFr Bld: 5.7 % — ABNORMAL HIGH (ref 4.8–5.6)

## 2022-01-31 LAB — TSH: TSH: 1.52 u[IU]/mL (ref 0.450–4.500)

## 2022-01-31 MED ORDER — NA SULFATE-K SULFATE-MG SULF 17.5-3.13-1.6 GM/177ML PO SOLN: 354.0000 mL | Freq: Once | ORAL | 0 refills | Status: AC

## 2022-01-31 NOTE — Telephone Encounter (Signed)
Gastroenterology Pre-Procedure Review  Request Date: 02/26/2022 Requesting Physician: Dr. Marius Ditch  PATIENT REVIEW QUESTIONS: The patient responded to the following health history questions as indicated:    1. Are you having any GI issues? no 2. Do you have a personal history of Polyps? no 3. Do you have a family history of Colon Cancer or Polyps? no 4. Diabetes Mellitus? no 5. Joint replacements in the past 12 months?no 6. Major health problems in the past 3 months?no 7. Any artificial heart valves, MVP, or defibrillator?no    MEDICATIONS & ALLERGIES:    Patient reports the following regarding taking any anticoagulation/antiplatelet therapy:   Plavix, Coumadin, Eliquis, Xarelto, Lovenox, Pradaxa, Brilinta, or Effient? no Aspirin? no  Patient confirms/reports the following medications:  Current Outpatient Medications  Medication Sig Dispense Refill   ferrous sulfate 325 (65 FE) MG EC tablet Take 325 mg by mouth daily with breakfast.     No current facility-administered medications for this visit.    Patient confirms/reports the following allergies:  No Known Allergies  No orders of the defined types were placed in this encounter.   AUTHORIZATION INFORMATION Primary Insurance: 1D#: Group #:  Secondary Insurance: 1D#: Group #:  SCHEDULE INFORMATION: Date:  Time: Location:

## 2022-02-03 LAB — CYTOLOGY - PAP
Comment: NEGATIVE
Diagnosis: NEGATIVE
High risk HPV: NEGATIVE

## 2022-02-17 ENCOUNTER — Telehealth: Payer: Self-pay | Admitting: *Deleted

## 2022-02-17 NOTE — Telephone Encounter (Signed)
Patient called and left a voicemail to reschedule colonoscopy. It was schedule for 02/26/2022 and it has been reschedule 03/17/2022. Patient verbalized understanding.   New instructions with the new date on been send to patient.

## 2022-03-03 ENCOUNTER — Ambulatory Visit
Admission: RE | Admit: 2022-03-03 | Discharge: 2022-03-03 | Disposition: A | Discharge status: Home / Self Care | Payer: No Typology Code available for payment source | Source: Ambulatory Visit | Attending: Internal Medicine | Admitting: Internal Medicine

## 2022-03-03 DIAGNOSIS — Z1231 Encounter for screening mammogram for malignant neoplasm of breast: Secondary | ICD-10-CM | POA: Insufficient documentation

## 2022-03-17 ENCOUNTER — Encounter: Payer: Self-pay | Admitting: Gastroenterology

## 2022-03-17 ENCOUNTER — Ambulatory Visit: Payer: No Typology Code available for payment source | Admitting: Certified Registered"

## 2022-03-17 ENCOUNTER — Encounter
Admission: RE | Disposition: A | Discharge status: Home / Self Care | Payer: Self-pay | Source: Home / Self Care | Attending: Gastroenterology

## 2022-03-17 ENCOUNTER — Ambulatory Visit
Admission: RE | Admit: 2022-03-17 | Discharge: 2022-03-17 | Disposition: A | Discharge status: Home / Self Care | Payer: No Typology Code available for payment source | Attending: Gastroenterology | Admitting: Gastroenterology

## 2022-03-17 DIAGNOSIS — D649 Anemia, unspecified: Secondary | ICD-10-CM | POA: Insufficient documentation

## 2022-03-17 DIAGNOSIS — Z1211 Encounter for screening for malignant neoplasm of colon: Secondary | ICD-10-CM | POA: Diagnosis present

## 2022-03-17 DIAGNOSIS — Z86010 Personal history of colonic polyps: Secondary | ICD-10-CM | POA: Insufficient documentation

## 2022-03-17 DIAGNOSIS — K219 Gastro-esophageal reflux disease without esophagitis: Secondary | ICD-10-CM | POA: Insufficient documentation

## 2022-03-17 DIAGNOSIS — D759 Disease of blood and blood-forming organs, unspecified: Secondary | ICD-10-CM | POA: Insufficient documentation

## 2022-03-17 HISTORY — PX: COLONOSCOPY WITH PROPOFOL: SHX5780

## 2022-03-17 SURGERY — COLONOSCOPY WITH PROPOFOL
Anesthesia: General

## 2022-03-17 MED ORDER — STERILE WATER FOR IRRIGATION IR SOLN
Status: DC | PRN
  Administered 2022-03-17: 240 mL

## 2022-03-17 MED ORDER — PROPOFOL 10 MG/ML IV BOLUS
INTRAVENOUS | Status: DC | PRN
  Administered 2022-03-17: 60 mg via INTRAVENOUS

## 2022-03-17 MED ORDER — LIDOCAINE HCL (CARDIAC) PF 100 MG/5ML IV SOSY
PREFILLED_SYRINGE | INTRAVENOUS | Status: DC | PRN
  Administered 2022-03-17: 50 mg via INTRAVENOUS

## 2022-03-17 MED ORDER — PROPOFOL 500 MG/50ML IV EMUL
INTRAVENOUS | Status: DC | PRN
  Administered 2022-03-17: 100 ug/kg/min via INTRAVENOUS

## 2022-03-17 MED ORDER — SODIUM CHLORIDE 0.9 % IV SOLN: INTRAVENOUS | Status: DC

## 2022-03-17 NOTE — Op Note (Signed)
[facility] Gastroenterology Patient Name: Brittany Wiley Procedure Date: 03/17/2022 9:29 AM MRN: [id number] Account #: [id number] Date of Birth: 01/26/1966 Admit Type: Outpatient Age: 56 Room: [facility] ENDO ROOM 2 Gender: Female Note Status: Finalized Instrument Name: Jasper Riling [id number] Procedure:             Colonoscopy Indications:           Surveillance: Personal history of adenomatous polyps                         on last colonoscopy 5 years ago, Last colonoscopy: May                         2018 Providers:             Lin Landsman MD, MD Referring MD:          Lin Landsman MD, MD (Referring MD) Medicines:             General Anesthesia Complications:         No immediate complications. Estimated blood loss: None. Procedure:             Pre-Anesthesia Assessment:                        - Prior to the procedure, a History and Physical was                         performed, and patient medications and allergies were                         reviewed. The patient is competent. The risks and                         benefits of the procedure and the sedation options and                         risks were discussed with the patient. All questions                         were answered and informed consent was obtained.                         Patient identification and proposed procedure were                         verified by the physician, the nurse, the                         anesthesiologist, the anesthetist and the technician                         in the pre-procedure area in the procedure room in the                         endoscopy suite. Mental Status Examination: alert and                         oriented. Airway Examination: normal oropharyngeal  airway and neck mobility. Respiratory Examination:                         clear to auscultation. CV Examination: normal.                         Prophylactic Antibiotics: The  patient does not require                         prophylactic antibiotics. Prior Anticoagulants: The                         patient has taken no previous anticoagulant or                         antiplatelet agents. ASA Grade Assessment: II - A                         patient with mild systemic disease. After reviewing                         the risks and benefits, the patient was deemed in                         satisfactory condition to undergo the procedure. The                         anesthesia plan was to use general anesthesia.                         Immediately prior to administration of medications,                         the patient was re-assessed for adequacy to receive                         sedatives. The heart rate, respiratory rate, oxygen                         saturations, blood pressure, adequacy of pulmonary                         ventilation, and response to care were monitored                         throughout the procedure. The physical status of the                         patient was re-assessed after the procedure.                        After obtaining informed consent, the colonoscope was                         passed under direct vision. Throughout the procedure,                         the patient's blood pressure, pulse, and oxygen  saturations were monitored continuously. The                         Colonoscope was introduced through the anus and                         advanced to the the cecum, identified by appendiceal                         orifice and ileocecal valve. The colonoscopy was                         performed without difficulty. The patient tolerated                         the procedure well. The quality of the bowel                         preparation was evaluated using the BBPS [facility] Bowel                         Preparation Scale) with scores of: Right Colon = 3,                         Transverse  Colon = 3 and Left Colon = 3 (entire mucosa                         seen well with no residual staining, small fragments                         of stool or opaque liquid). The total BBPS score                         equals 9. Findings:      The perianal and digital rectal examinations were normal. Pertinent       negatives include normal sphincter tone and no palpable rectal lesions.      The entire examined colon appeared normal.      The retroflexed view of the distal rectum and anal verge was normal and       showed no anal or rectal abnormalities. Impression:            - The entire examined colon is normal.                        - No specimens collected. Recommendation:        - Discharge patient to home (with escort).                        - Resume previous diet today.                        - Continue present medications.                        - Repeat colonoscopy in 10 years for screening                         purposes. Procedure Code(s):     ---  Professional ---                        V8938, Colorectal cancer screening; colonoscopy on                         individual at high risk Diagnosis Code(s):     --- Professional ---                        Z86.010, Personal history of colonic polyps CPT copyright 2019 American Medical Association. All rights reserved. The codes documented in this report are preliminary and upon coder review may  be revised to meet current compliance requirements. Dr. Ulyess Mort Lin Landsman MD, MD 03/17/2022 9:46:32 AM This report has been signed electronically. Number of Addenda: 0 Note Initiated On: 03/17/2022 9:29 AM Scope Withdrawal Time: 0 hours 6 minutes 2 seconds  Total Procedure Duration: 0 hours 8 minutes 49 seconds  Estimated Blood Loss:  Estimated blood loss: none. Estimated blood loss: none.      [facility]

## 2022-03-17 NOTE — Anesthesia Postprocedure Evaluation (Signed)
Anesthesia Post Note  Patient: Brittany Wiley  Procedure(s) Performed: COLONOSCOPY WITH PROPOFOL  Patient location during evaluation: PACU Anesthesia Type: General Level of consciousness: awake and oriented Pain management: pain level controlled Respiratory status: spontaneous breathing and respiratory function stable Cardiovascular status: stable Anesthetic complications: no   No notable events documented.   Last Vitals:  Vitals:   03/17/22 0958 03/17/22 1014  BP: 101/67   Pulse:    Resp:    Temp:    SpO2:  99%    Last Pain:  Vitals:   03/17/22 1014  TempSrc:   PainSc: 0-No pain                 VAN STAVEREN,Zayvon Alicea

## 2022-03-17 NOTE — Transfer of Care (Signed)
Immediate Anesthesia Transfer of Care Note  Patient: Brittany Wiley  Procedure(s) Performed: COLONOSCOPY WITH PROPOFOL  Patient Location: PACU and Endoscopy Unit  Anesthesia Type:General  Level of Consciousness: drowsy  Airway & Oxygen Therapy: Patient Spontanous Breathing  Post-op Assessment: Report given to RN  Post vital signs: stable  Last Vitals:  Vitals Value Taken Time  BP 82/55 03/17/22 0950  Temp    Pulse 91 03/17/22 0950  Resp 23 03/17/22 0950  SpO2 100 % 03/17/22 0950  Vitals shown include unvalidated device data.  Last Pain:  Vitals:   03/17/22 0846  TempSrc: Temporal  PainSc: 0-No pain         Complications: No notable events documented.

## 2022-03-17 NOTE — Anesthesia Preprocedure Evaluation (Signed)
Anesthesia Evaluation  Patient identified by MRN, date of birth, ID band Patient awake    Reviewed: Allergy & Precautions, NPO status , Patient's Chart, lab work & pertinent test results  Airway Mallampati: III  TM Distance: >3 FB     Dental  (+) Teeth Intact   Pulmonary neg pulmonary ROS,    breath sounds clear to auscultation       Cardiovascular Exercise Tolerance: Good  Rhythm:Regular     Neuro/Psych negative neurological ROS  negative psych ROS   GI/Hepatic Neg liver ROS, GERD  Medicated,  Endo/Other  negative endocrine ROS  Renal/GU negative Renal ROS     Musculoskeletal negative musculoskeletal ROS (+)   Abdominal Normal abdominal exam  (+)   Peds negative pediatric ROS (+)  Hematology  (+) Blood dyscrasia, anemia ,   Anesthesia Other Findings   Reproductive/Obstetrics negative OB ROS                             Anesthesia Physical Anesthesia Plan  ASA: 2  Anesthesia Plan: General   Post-op Pain Management:    Induction: Intravenous  PONV Risk Score and Plan:   Airway Management Planned: Natural Airway  Additional Equipment:   Intra-op Plan:   Post-operative Plan:   Informed Consent: I have reviewed the patients History and Physical, chart, labs and discussed the procedure including the risks, benefits and alternatives for the proposed anesthesia with the patient or authorized representative who has indicated his/her understanding and acceptance.       Plan Discussed with: CRNA and Surgeon  Anesthesia Plan Comments:         Anesthesia Quick Evaluation

## 2022-03-17 NOTE — H&P (Signed)
  Cephas Darby, MD [address]  [place]  [place], [place] [address]  Main: [phone]  Fax: [phone] Pager: [phone]  Primary Care Physician:  Glean Hess, MD Primary Gastroenterologist:  Dr. Cephas Darby  Pre-Procedure History & Physical: HPI:  KALAN RINN is a 56 y.o. female is here for an colonoscopy.   Past Medical History:  Diagnosis Date   GERD (gastroesophageal reflux disease)     Past Surgical History:  Procedure Laterality Date   CESAREAN SECTION     COLONOSCOPY WITH PROPOFOL N/A 10/21/2016   Procedure: COLONOSCOPY WITH PROPOFOL;  Surgeon: Lucilla Lame, MD;  Location: [facility] ENDOSCOPY;  Service: Endoscopy;  Laterality: N/A;    Prior to Admission medications   Medication Sig Start Date End Date Taking? Authorizing Provider  ferrous sulfate 325 (65 FE) MG EC tablet Take 325 mg by mouth daily with breakfast.   Yes [provider]    Allergies as of 01/31/2022   (No Known Allergies)    Family History  Problem Relation Age of Onset   Hypertension Father    Diabetes Mother    Breast cancer Neg Hx     Social History   Socioeconomic History   Marital status: Married    Spouse name: Not on file   Number of children: Not on file   Years of education: Not on file   Highest education level: Not on file  Occupational History   Not on file  Tobacco Use   Smoking status: Never   Smokeless tobacco: Never  Vaping Use   Vaping Use: Never used  Substance and Sexual Activity   Alcohol use: Yes    Alcohol/week: 0.0 standard drinks of alcohol    Comment: rarely   Drug use: No   Sexual activity: Yes  Other Topics Concern   Not on file  Social History Narrative   Not on file   Social Determinants of Health   Financial Resource Strain: Not on file  Food Insecurity: Not on file  Transportation Needs: Not on file  Physical Activity: Not on file  Stress: Not on file  Social Connections: Not on file  Intimate Partner Violence:  Not on file    Review of Systems: See HPI, otherwise negative ROS  Physical Exam: BP 123/71   Pulse 77   Temp (!) 96.5 F (35.8 C) (Temporal)   Resp 16   Ht '5\' 6"'$  (1.676 m)   Wt 72.6 kg   SpO2 98%   BMI 25.82 kg/m  General:   Alert,  pleasant and cooperative in NAD Head:  Normocephalic and atraumatic. Neck:  Supple; no masses or thyromegaly. Lungs:  Clear throughout to auscultation.    Heart:  Regular rate and rhythm. Abdomen:  Soft, nontender and nondistended. Normal bowel sounds, without guarding, and without rebound.   Neurologic:  Alert and  oriented x4;  grossly normal neurologically.  Impression/Plan: Dejanee Lynita Lombard is here for an colonoscopy to be performed for h/o colon adenoma  Risks, benefits, limitations, and alternatives regarding  colonoscopy have been reviewed with the patient.  Questions have been answered.  All parties agreeable.   Sherri Sear, MD  03/17/2022, 9:26 AM

## 2022-05-01 ENCOUNTER — Ambulatory Visit: Payer: No Typology Code available for payment source

## 2022-10-23 ENCOUNTER — Ambulatory Visit (INDEPENDENT_AMBULATORY_CARE_PROVIDER_SITE_OTHER): Payer: No Typology Code available for payment source | Admitting: Internal Medicine

## 2022-10-23 ENCOUNTER — Encounter: Payer: Self-pay | Admitting: Internal Medicine

## 2022-10-23 VITALS — BP 110/60 | HR 85 | Ht 66.0 in | Wt 148.0 lb

## 2022-10-23 DIAGNOSIS — L723 Sebaceous cyst: Secondary | ICD-10-CM

## 2022-10-23 DIAGNOSIS — L089 Local infection of the skin and subcutaneous tissue, unspecified: Secondary | ICD-10-CM

## 2022-10-23 NOTE — Progress Notes (Signed)
Date:  10/23/2022   Name:  Brittany Wiley   DOB:  1965/11/13   MRN:  [id number]   Chief Complaint: Recurrent Skin Infections (X 1 week. Tender to touch. Pink in color.)  Rash This is a recurrent problem. The affected locations include the back. The rash is characterized by swelling. She was exposed to nothing. Pertinent negatives include no fatigue, fever or shortness of breath. Past treatments include antibiotics (taking amoxicillin from [place] bid).    Lab Results  Component Value Date   NA 140 01/30/2022   K 4.1 01/30/2022   CO2 23 01/30/2022   GLUCOSE 101 (H) 01/30/2022   BUN 7 01/30/2022   CREATININE 0.74 01/30/2022   CALCIUM 9.2 01/30/2022   EGFR 95 01/30/2022   GFRNONAA 103 10/12/2018   Lab Results  Component Value Date   CHOL 263 (H) 01/30/2022   HDL 57 01/30/2022   LDLCALC 185 (H) 01/30/2022   TRIG 116 01/30/2022   CHOLHDL 4.6 (H) 01/30/2022   Lab Results  Component Value Date   TSH 1.520 01/30/2022   Lab Results  Component Value Date   HGBA1C 5.7 (H) 01/30/2022   Lab Results  Component Value Date   WBC 4.7 01/30/2022   HGB 12.9 01/30/2022   HCT 37.9 01/30/2022   MCV 91 01/30/2022   PLT 257 01/30/2022   Lab Results  Component Value Date   ALT 12 01/30/2022   AST 20 01/30/2022   ALKPHOS 84 01/30/2022   BILITOT 0.5 01/30/2022   No results found for: "25OHVITD2", "25OHVITD3", "VD25OH"   Review of Systems  Constitutional:  Negative for chills, fatigue and fever.  Respiratory:  Negative for chest tightness and shortness of breath.   Cardiovascular:  Negative for chest pain.  Skin:  Positive for rash.  Psychiatric/Behavioral:  Negative for dysphoric mood and sleep disturbance. The patient is not nervous/anxious.     Patient Active Problem List   Diagnosis Date Noted   Uterine leiomyoma 01/30/2022   Plantar fasciitis, bilateral 01/30/2022   Anemia, iron deficiency 04/29/2021   Screen for colon cancer    Polyp of sigmoid colon    Hyperlipidemia,  mixed 09/22/2016   Family history of diabetes mellitus 04/20/2015    No Known Allergies  Past Surgical History:  Procedure Laterality Date   CESAREAN SECTION     COLONOSCOPY WITH PROPOFOL N/A 10/21/2016   Procedure: COLONOSCOPY WITH PROPOFOL;  Surgeon: Midge Minium, MD;  Location[facility] ENDOSCOPY;  Service: Endoscopy;  Laterality: N/A;   COLONOSCOPY WITH PROPOFOL N/A 03/17/2022   Procedure: COLONOSCOPY WITH PROPOFOL;  Surgeon: Toney Reil, MD;  Location: [facility] ENDOSCOPY;  Service: Gastroenterology;  Laterality: N/A;    Social History   Tobacco Use   Smoking status: Never   Smokeless tobacco: Never  Vaping Use   Vaping Use: Never used  Substance Use Topics   Alcohol use: Yes    Alcohol/week: 0.0 standard drinks of alcohol    Comment: rarely   Drug use: No     Medication list has been reviewed and updated.  Current Meds  Medication Sig   ferrous sulfate 325 (65 FE) MG EC tablet Take 325 mg by mouth daily with breakfast.   Omega-3 Fatty Acids (FISH OIL) 300 MG CAPS Take by mouth.       10/23/2022   10:38 AM 01/30/2022    9:46 AM 04/29/2021    9:32 AM 01/30/2021    9:36 AM  GAD 7 : Generalized Anxiety Score  Nervous,  Anxious, on Edge 0 0 0 0  Control/stop worrying 0 0 0 0  Worry too much - different things 0 0 0 0  Trouble relaxing 0 0 0 0  Restless 0 0 0 0  Easily annoyed or irritable 0 0 0 0  Afraid - awful might happen 0 0 0 0  Total GAD 7 Score 0 0 0 0  Anxiety Difficulty Not difficult at all Not difficult at all  Not difficult at all       10/23/2022   10:38 AM 01/30/2022    9:45 AM 04/29/2021    9:32 AM  Depression screen PHQ 2/9  Decreased Interest 0 0 0  Down, Depressed, Hopeless 0 0 1  PHQ - 2 Score 0 0 1  Altered sleeping 0 0 0  Tired, decreased energy 0 0 1  Change in appetite 0 0 0  Feeling bad or failure about yourself  0 0 0  Trouble concentrating 0 0 1  Moving slowly or fidgety/restless 0 0 0  Suicidal thoughts 0 0 0  PHQ-9 Score 0 0 3   Difficult doing work/chores Not difficult at all Not difficult at all Not difficult at all    BP Readings from Last 3 Encounters:  10/23/22 110/60  03/17/22 101/67  01/30/22 120/68    Physical Exam Vitals and nursing note reviewed.  Constitutional:      General: She is not in acute distress.    Appearance: She is well-developed.  HENT:     Head: Normocephalic and atraumatic.  Pulmonary:     Effort: Pulmonary effort is normal. No respiratory distress.  Skin:    General: Skin is warm and dry.     Findings: No rash.          Comments: 1.5 cm soft, tender, warm sebaceous cyst without drainage.  Neurological:     Mental Status: She is alert and oriented to person, place, and time.  Psychiatric:        Mood and Affect: Mood normal.        Behavior: Behavior normal.     Wt Readings from Last 3 Encounters:  10/23/22 148 lb (67.1 kg)  03/17/22 160 lb (72.6 kg)  01/30/22 155 lb 6.4 oz (70.5 kg)    BP 110/60   Pulse 85   Ht 5\' 6"  (1.676 m)   Wt 148 lb (67.1 kg)   SpO2 96%   BMI 23.89 kg/m   Assessment and Plan:  Problem List Items Addressed This Visit   None Visit Diagnoses     Infected sebaceous cyst of skin    -  Primary   continue Amox bid refer to Gen Surgery for definitive excision   Relevant Orders   Ambulatory referral to General Surgery       No follow-ups on file.   Partially dictated using Dragon software, any errors are not intentional.  Reubin Milan, MD [facility] [place], [place]

## 2022-10-28 ENCOUNTER — Ambulatory Visit (INDEPENDENT_AMBULATORY_CARE_PROVIDER_SITE_OTHER): Payer: No Typology Code available for payment source | Admitting: Surgery

## 2022-10-28 ENCOUNTER — Encounter: Payer: Self-pay | Admitting: Surgery

## 2022-10-28 VITALS — BP 115/72 | HR 87 | Temp 98.5°F | Ht 66.0 in | Wt 150.8 lb

## 2022-10-28 DIAGNOSIS — L723 Sebaceous cyst: Secondary | ICD-10-CM

## 2022-10-28 NOTE — Progress Notes (Signed)
Patient ID: Brittany Wiley, female   DOB: [birth year], 57 y.o.   MRN: [id number]  Chief Complaint: Dermal cyst of back.  History of Present Illness Brittany Wiley is a 57 y.o. female with 2 cysts very close to each other in the left infra scapular area.  She denies any particular drainage, notes recent inflammatory changes worsening with associated tenderness to this area.  There has been swelling recently which brought to her attention despite the fact that she has had it for years.  Has not taken any antibiotics or undergone any prior incision and drainage.  She would like to proceed with its excision today.  Past Medical History Past Medical History:  Diagnosis Date   GERD (gastroesophageal reflux disease)       Past Surgical History:  Procedure Laterality Date   CESAREAN SECTION     COLONOSCOPY WITH PROPOFOL N/A 10/21/2016   Procedure: COLONOSCOPY WITH PROPOFOL;  Surgeon: Midge Minium, MD;  Location: [facility] ENDOSCOPY;  Service: Endoscopy;  Laterality: N/A;   COLONOSCOPY WITH PROPOFOL N/A 03/17/2022   Procedure: COLONOSCOPY WITH PROPOFOL;  Surgeon: Toney Reil, MD;  Location: [facility] ENDOSCOPY;  Service: Gastroenterology;  Laterality: N/A;    No Known Allergies  Current Outpatient Medications  Medication Sig Dispense Refill   ferrous sulfate 325 (65 FE) MG EC tablet Take 325 mg by mouth daily with breakfast.     Omega-3 Fatty Acids (FISH OIL) 300 MG CAPS Take by mouth.     No current facility-administered medications for this visit.    Family History Family History  Problem Relation Age of Onset   Hypertension Father    Diabetes Mother    Breast cancer Neg Hx       Social History Social History   Tobacco Use   Smoking status: Never   Smokeless tobacco: Never  Vaping Use   Vaping Use: Never used  Substance Use Topics   Alcohol use: Yes    Alcohol/week: 0.0 standard drinks of alcohol    Comment: rarely   Drug use: No        Review of Systems  Constitutional:  Negative.   HENT: Negative.    Eyes: Negative.   Respiratory: Negative.    Cardiovascular: Negative.   Genitourinary: Negative.   Skin: Negative.   Neurological: Negative.   Psychiatric/Behavioral: Negative.       Physical Exam Blood pressure 115/72, pulse 87, temperature 98.5 F (36.9 C), temperature source Oral, height 5\' 6"  (1.676 m), weight 150 lb 12.8 oz (68.4 kg), SpO2 96 %. Last Weight  Most recent update: 10/28/2022 11:10 AM    Weight  68.4 kg (150 lb 12.8 oz)             CONSTITUTIONAL: Well developed, and nourished, appropriately responsive and aware without distress.   EYES: Sclera non-icteric.   EARS, NOSE, MOUTH AND THROAT:  The oropharynx is clear. Oral mucosa is pink and moist.     Hearing is intact to voice.  NECK: Trachea is midline, and there is no jugular venous distension.  LYMPH NODES:  Lymph nodes in the neck are not appreciated. RESPIRATORY:   Normal respiratory effort without pathologic use of accessory muscles. CARDIOVASCULAR: Well perfused.  GI: The abdomen is  soft, nontender, and nondistended.  MUSCULOSKELETAL:  Symmetrical muscle tone appreciated in all four extremities.    SKIN: Skin turgor is normal. No pathologic skin lesions appreciated.  On the left lateral thoracolumbar back there is a raised area that hyperemic, with associated  punctum consistent with a dermal cyst.  Slightly tender to touch.  Measuring in diameter approximately 3-1/2 cm. NEUROLOGIC:  Motor and sensation appear grossly normal.  Cranial nerves are grossly without defect. PSYCH:  Alert and oriented to person, place and time. Affect is appropriate for situation.  Data Reviewed I have personally reviewed what is currently available of the patient's imaging, recent labs and medical records.   Labs:     Latest Ref Rng & Units 01/30/2022   10:45 AM 04/29/2021    9:49 AM 01/30/2021   10:01 AM  CBC  WBC 3.4 - 10.8 x10E3/uL 4.7  4.5  4.0   Hemoglobin 11.1 - 15.9 g/dL 16.1  09.6  8.3    Hematocrit 34.0 - 46.6 % 37.9  39.1  28.3   Platelets 150 - 450 x10E3/uL 257  284  323       Latest Ref Rng & Units 01/30/2022   10:45 AM 01/30/2021   10:01 AM 10/12/2018    9:10 AM  CMP  Glucose 70 - 99 mg/dL 045  96  99   BUN 6 - 24 mg/dL 7  6  6    Creatinine 0.57 - 1.00 mg/dL 4.09  8.11  9.14   Sodium 134 - 144 mmol/L 140  139  141   Potassium 3.5 - 5.2 mmol/L 4.1  4.0  4.1   Chloride 96 - 106 mmol/L 103  104  106   CO2 20 - 29 mmol/L 23  22  24    Calcium 8.7 - 10.2 mg/dL 9.2  8.8  8.8   Total Protein 6.0 - 8.5 g/dL 6.9  7.2  6.6   Total Bilirubin 0.0 - 1.2 mg/dL 0.5  <7.8  0.3   Alkaline Phos 44 - 121 IU/L 84  71  72   AST 0 - 40 IU/L 20  13  14    ALT 0 - 32 IU/L 12  10  12        Imaging: Radiological images reviewed:   Within last 24 hrs: No results found.  Assessment    Inflamed sebaceous cyst, 3.5 cm of back. Patient Active Problem List   Diagnosis Date Noted   Uterine leiomyoma 01/30/2022   Plantar fasciitis, bilateral 01/30/2022   Anemia, iron deficiency 04/29/2021   Screen for colon cancer    Polyp of sigmoid colon    Hyperlipidemia, mixed 09/22/2016   Family history of diabetes mellitus 04/20/2015    Plan    Excision of 3.5 cm benign dermal lesion of the left mid back consistent with inflamed sebaceous cyst.  Pre-operative Diagnosis: Inflamed sebaceous cyst of the left thoracolumbar back.  Post-operative Diagnosis: same.    Surgeon: Campbell Lerner, M.D., FACS  Anesthesia: Local   Findings: Consistent with ruptured sebaceous cyst, able to adequately removed all appreciated or perceived cystic remnant.  Estimated Blood Loss: 2 mL         Specimens: Skin and underlying cystic components, discarded.          Complications: none              Procedure Details  The patient was evaluated, the benefits, complications, treatment options, and expected outcomes were discussed with the patient. The risks of bleeding, infection, recurrence of symptoms,  failure to resolve symptoms, unanticipated injury, any of which could require further surgery were reviewed with the patient. The likelihood of improving the patient's symptoms with return to their baseline status is expected.  The patient and/or family concurred with the  proposed plan, giving informed consent.  The patient was taken to our procedure room, identified and the procedure verified.    The patient was positioned in the prone position and the left mid back was prepped with  Chloraprep and draped in the sterile fashion.  A Time Out was held and the above information confirmed.  Local infiltration of 1% lidocaine with epinephrine is applied to adequate anesthetic effect.  An elliptical incision is made to remove the punctum.  The underlying fluctuant cystic cavity was then opened.  The cystic components were then completely removed with sharp dissection.  Hemostasis obtained.  Dermis reapproximated with interrupted 3-0 Vicryl's.  After irrigating the subcutaneous tissues with more local anesthetic, the skin was then sealed with Dermabond.  Face-to-face time spent with the patient and accompanying care providers(if present) was 30 minutes, with more than 50% of the time spent counseling, educating, and coordinating care of the patient.    These notes generated with voice recognition software. I apologize for typographical errors.  Campbell Lerner M.D., FACS 10/28/2022, 11:12 AM

## 2022-10-28 NOTE — Patient Instructions (Addendum)
If you have any concerns or questions, please feel free to call our office. See follow up appointment.   Excision of Skin Lesions, Care After  The following information offers guidance on how to care for yourself after your procedure. Your health care provider may also give you more specific instructions. If you have problems or questions, contact your health care provider. What can I expect after the procedure? After your procedure, it is common to have: Soreness or mild pain. Some redness and swelling. Follow these instructions at home: Excision site care  Follow instructions from your health care provider about how to take care of your excision site. Make sure you: Wash your hands with soap and water for at least 20 seconds before and after you change your bandage (dressing). If soap and water are not available, use hand sanitizer. Change your dressing as told by your health care provider. Leave stitches (sutures), skin glue, or adhesive strips in place. These skin closures may need to stay in place for 2 weeks or longer. If adhesive strip edges start to loosen and curl up, you may trim the loose edges. Do not remove adhesive strips completely unless your health care provider tells you to do that. Check the excision area every day for signs of infection. Watch for: More redness, swelling, or pain. Fluid or blood. Warmth. Pus or a bad smell. Keep the site clean, dry, and protected for at least 48 hours. For bleeding, apply gentle but firm pressure to the area using a folded towel for 20 minutes. Do not take baths, swim, or use a hot tub until your health care provider approves. Ask your health care provider if you may take showers. You may only be allowed to take sponge baths. General instructions Take over-the-counter and prescription medicines only as told by your health care provider. Follow instructions from your health care provider about how to minimize scarring. Scarring should  lessen over time. Avoid sun exposure until the area has healed. Use sunscreen to protect the area from the sun after it has healed. Avoid high-impact exercise and activities until the sutures are removed or the area heals. Keep all follow-up visits. This is important. Contact a health care provider if: You have more redness, swelling, or pain around your excision site. You have fluid or blood coming from your excision site. Your excision site feels warm to the touch. You have pus or a bad smell coming from your excision site. You have a fever. You have pain that does not improve in 2-3 days after your procedure. Get help right away if: You have bleeding that does not stop with pressure or a dressing. Your wound opens up. Summary Take over-the-counter and prescription medicines only as told by your health care provider. Change your dressing as told by your health care provider. Contact a health care provider if you have redness, swelling, pain, or other signs of infection around your excision site. Keep all follow-up visits. This is important. This information is not intended to replace advice given to you by your health care provider. Make sure you discuss any questions you have with your health care provider. Document Revised: 12/11/2020 Document Reviewed: 12/11/2020 Elsevier Patient Education  2024 [employer] Inc.   

## 2022-11-06 ENCOUNTER — Encounter: Payer: Self-pay | Admitting: Surgery

## 2022-11-06 ENCOUNTER — Ambulatory Visit: Payer: No Typology Code available for payment source | Admitting: Surgery

## 2022-11-06 VITALS — BP 132/73 | HR 76 | Temp 98.6°F | Ht 67.0 in | Wt 148.8 lb

## 2022-11-06 DIAGNOSIS — L723 Sebaceous cyst: Secondary | ICD-10-CM

## 2022-11-06 DIAGNOSIS — Z09 Encounter for follow-up examination after completed treatment for conditions other than malignant neoplasm: Secondary | ICD-10-CM

## 2022-11-06 NOTE — Patient Instructions (Signed)
If you have any concerns or questions, please feel free to call our office. Follow up as needed.   Excision of Skin Lesions, Care After The following information offers guidance on how to care for yourself after your procedure. Your health care provider may also give you more specific instructions. If you have problems or questions, contact your health care provider. What can I expect after the procedure? After your procedure, it is common to have: Soreness or mild pain. Some redness and swelling. Follow these instructions at home: Excision site care  Follow instructions from your health care provider about how to take care of your excision site. Make sure you: Wash your hands with soap and water for at least 20 seconds before and after you change your bandage (dressing). If soap and water are not available, use hand sanitizer. Change your dressing as told by your health care provider. Leave stitches (sutures), skin glue, or adhesive strips in place. These skin closures may need to stay in place for 2 weeks or longer. If adhesive strip edges start to loosen and curl up, you may trim the loose edges. Do not remove adhesive strips completely unless your health care provider tells you to do that. Check the excision area every day for signs of infection. Watch for: More redness, swelling, or pain. Fluid or blood. Warmth. Pus or a bad smell. Keep the site clean, dry, and protected for at least 48 hours. For bleeding, apply gentle but firm pressure to the area using a folded towel for 20 minutes. Do not take baths, swim, or use a hot tub until your health care provider approves. Ask your health care provider if you may take showers. You may only be allowed to take sponge baths. General instructions Take over-the-counter and prescription medicines only as told by your health care provider. Follow instructions from your health care provider about how to minimize scarring. Scarring should lessen over  time. Avoid sun exposure until the area has healed. Use sunscreen to protect the area from the sun after it has healed. Avoid high-impact exercise and activities until the sutures are removed or the area heals. Keep all follow-up visits. This is important. Contact a health care provider if: You have more redness, swelling, or pain around your excision site. You have fluid or blood coming from your excision site. Your excision site feels warm to the touch. You have pus or a bad smell coming from your excision site. You have a fever. You have pain that does not improve in 2-3 days after your procedure. Get help right away if: You have bleeding that does not stop with pressure or a dressing. Your wound opens up. Summary Take over-the-counter and prescription medicines only as told by your health care provider. Change your dressing as told by your health care provider. Contact a health care provider if you have redness, swelling, pain, or other signs of infection around your excision site. Keep all follow-up visits. This is important. This information is not intended to replace advice given to you by your health care provider. Make sure you discuss any questions you have with your health care provider. Document Revised: 12/11/2020 Document Reviewed: 12/11/2020 Elsevier Patient Education  2024 [employer].

## 2022-11-06 NOTE — Progress Notes (Signed)
[facility] POST-OP OFFICE VISIT  11/06/2022  HPI: Brittany Wiley is a 57 y.o. female 9 days s/p excision of inflamed sebaceous cyst.  She denies fevers, chills, pain or drainage.  Vital signs: BP 132/73   Pulse 76   Temp 98.6 F (37 C) (Oral)   Ht 5\' 7"  (1.702 m)   Wt 148 lb 12.8 oz (67.5 kg)   SpO2 97%   BMI 23.31 kg/m    Physical Exam: Constitutional: She appears well. Skin: Her left scapular area incision is still covered with Dermabond.  There is no evidence of erythema, ecchymosis or induration.  The area is nontender.  Assessment/Plan: This is a 57 y.o. female 9 days s/p excision of inflamed sebaceous cyst from the left parascapular area.  Doing well.  Patient Active Problem List   Diagnosis Date Noted   Inflamed sebaceous cyst 10/28/2022   Uterine leiomyoma 01/30/2022   Plantar fasciitis, bilateral 01/30/2022   Anemia, iron deficiency 04/29/2021   Screen for colon cancer    Polyp of sigmoid colon    Hyperlipidemia, mixed 09/22/2016   Family history of diabetes mellitus 04/20/2015    -She may follow-up as needed, we remain readily available should any unanticipated increased pain or drainage occur.   Campbell Lerner M.D., FACS 11/06/2022, 10:56 AM

## 2023-02-04 ENCOUNTER — Ambulatory Visit (INDEPENDENT_AMBULATORY_CARE_PROVIDER_SITE_OTHER): Payer: No Typology Code available for payment source | Admitting: Internal Medicine

## 2023-02-04 ENCOUNTER — Encounter: Payer: Self-pay | Admitting: Internal Medicine

## 2023-02-04 VITALS — BP 104/62 | HR 66 | Ht 67.0 in | Wt 155.0 lb

## 2023-02-04 DIAGNOSIS — Z23 Encounter for immunization: Secondary | ICD-10-CM

## 2023-02-04 DIAGNOSIS — G8929 Other chronic pain: Secondary | ICD-10-CM

## 2023-02-04 DIAGNOSIS — Z1231 Encounter for screening mammogram for malignant neoplasm of breast: Secondary | ICD-10-CM

## 2023-02-04 DIAGNOSIS — E782 Mixed hyperlipidemia: Secondary | ICD-10-CM

## 2023-02-04 DIAGNOSIS — R7303 Prediabetes: Secondary | ICD-10-CM

## 2023-02-04 DIAGNOSIS — D509 Iron deficiency anemia, unspecified: Secondary | ICD-10-CM | POA: Diagnosis not present

## 2023-02-04 DIAGNOSIS — Z0000 Encounter for general adult medical examination without abnormal findings: Secondary | ICD-10-CM | POA: Diagnosis not present

## 2023-02-04 DIAGNOSIS — M25511 Pain in right shoulder: Secondary | ICD-10-CM

## 2023-02-04 IMAGING — MG MM DIGITAL SCREENING BILAT W/ TOMO AND CAD
8 series · 9 of 24 positions shown · non-contrast
Comparison: Previous exam(s).

CLINICAL DATA: Screening.

EXAM:
DIGITAL SCREENING BILATERAL MAMMOGRAM WITH TOMOSYNTHESIS AND CAD
TECHNIQUE: Bilateral screening digital craniocaudal and mediolateral oblique
mammograms were obtained. Bilateral screening digital breast
tomosynthesis was performed. The images were evaluated with
computer-aided detection.

[L CC synth-2D]
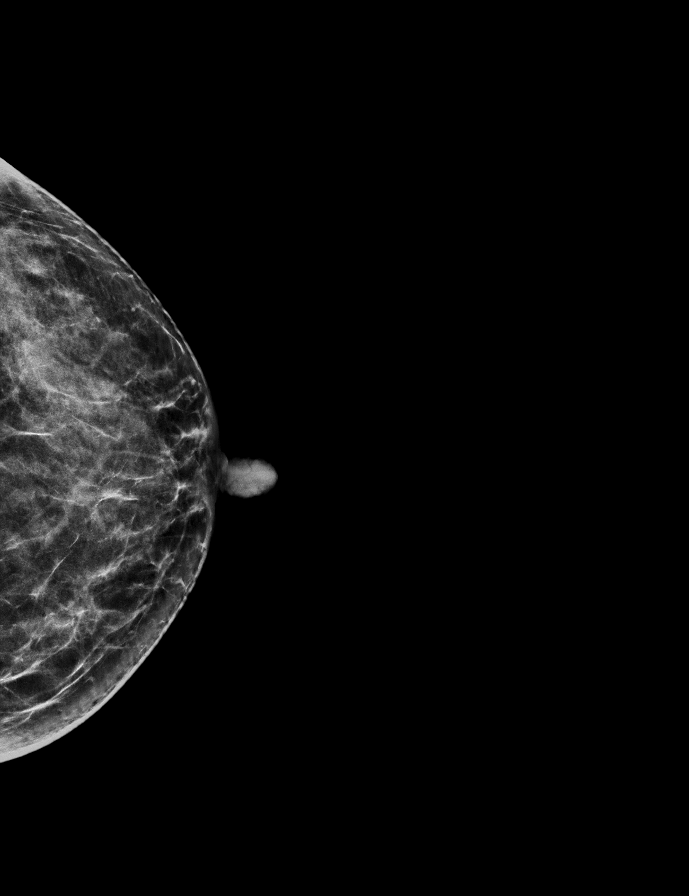

[R MLO synth-2D]
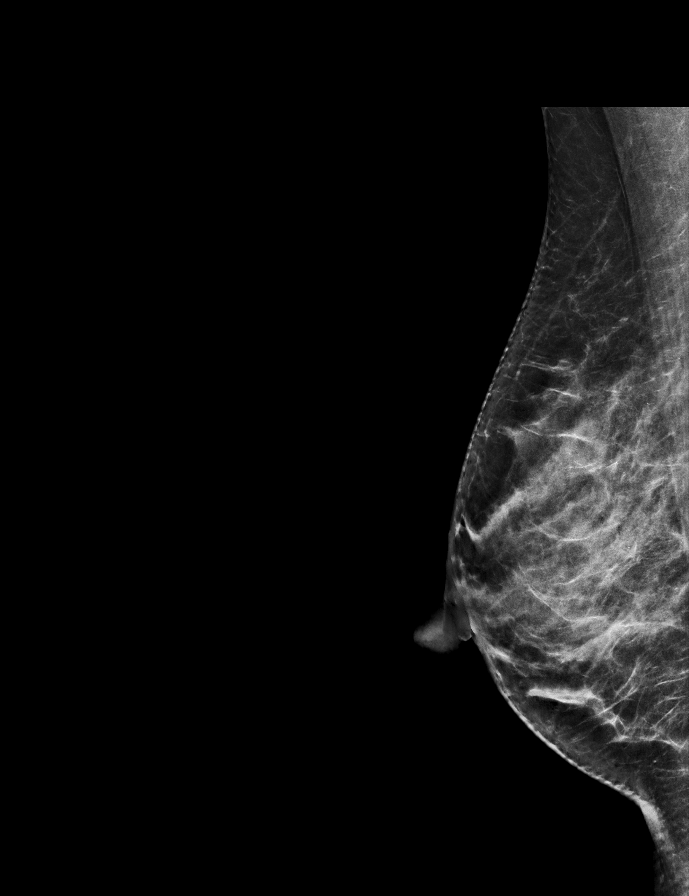

[L MLO synth-2D]
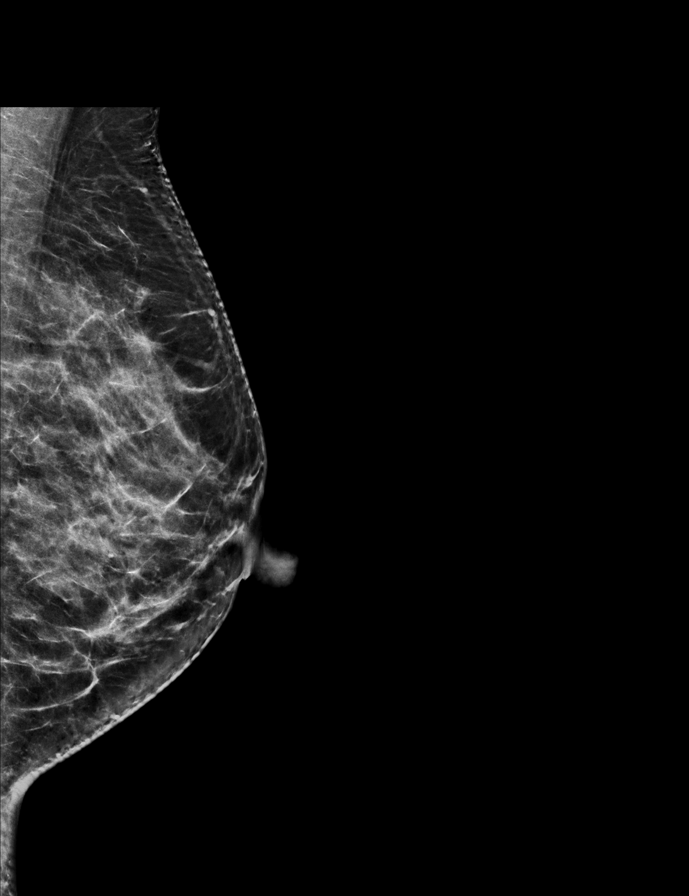

[R CC synth-2D]
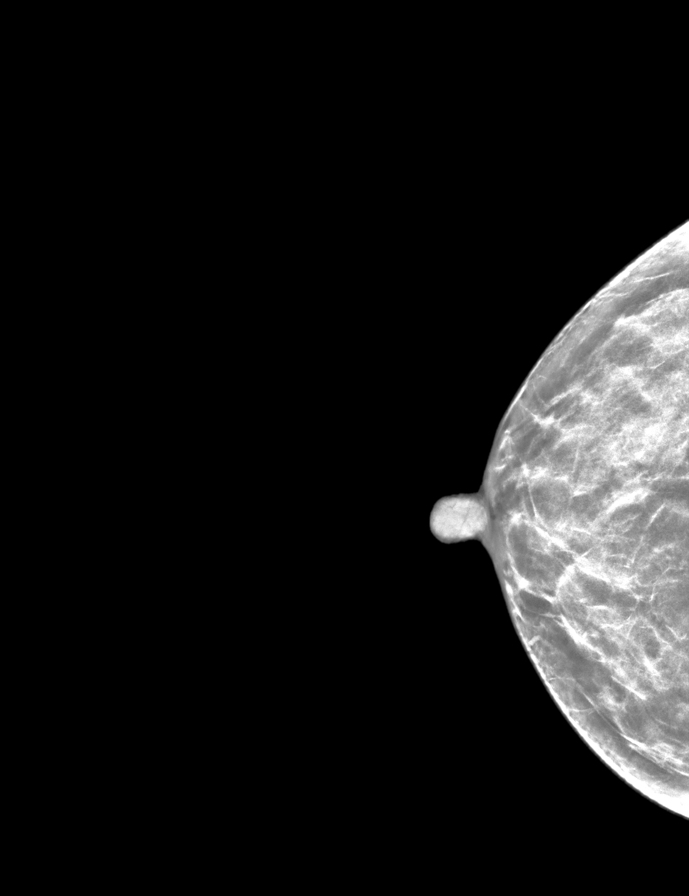

[R MLO tomo · 2 of 61 frames shown]
[frame 20/61]
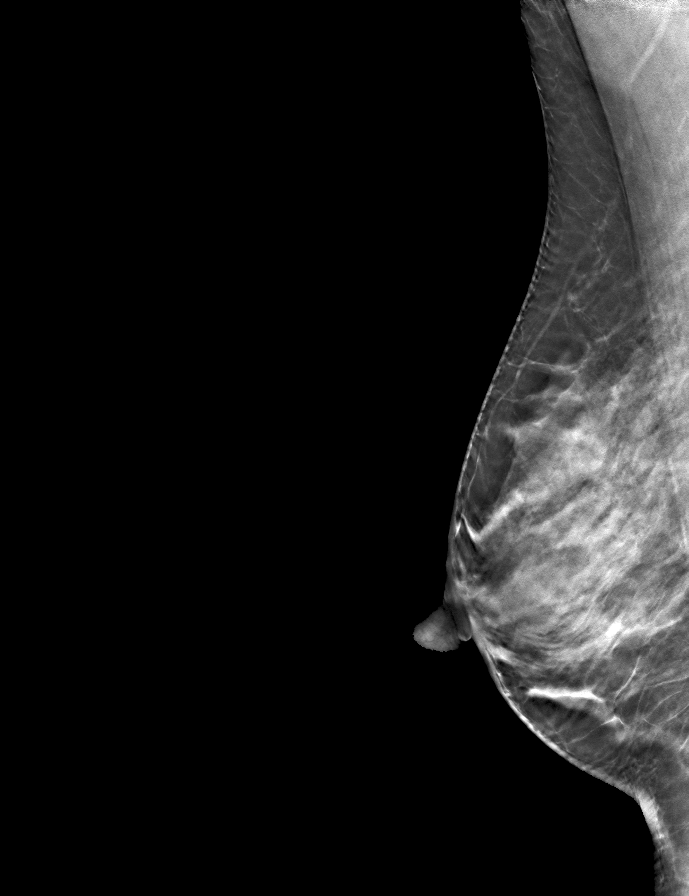
[frame 31/61]
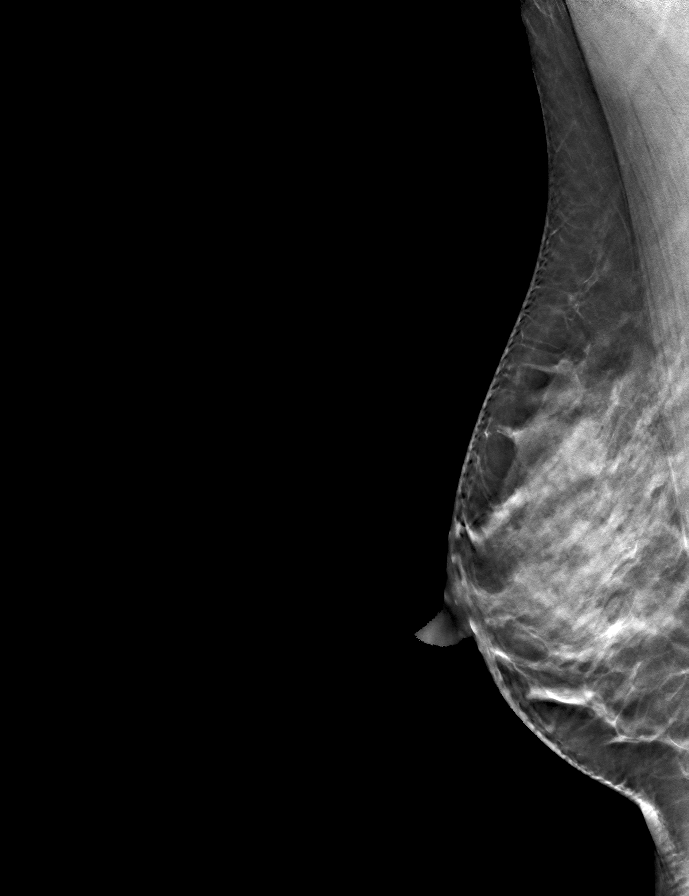

[L MLO tomo · tomo slice 29/58.0]
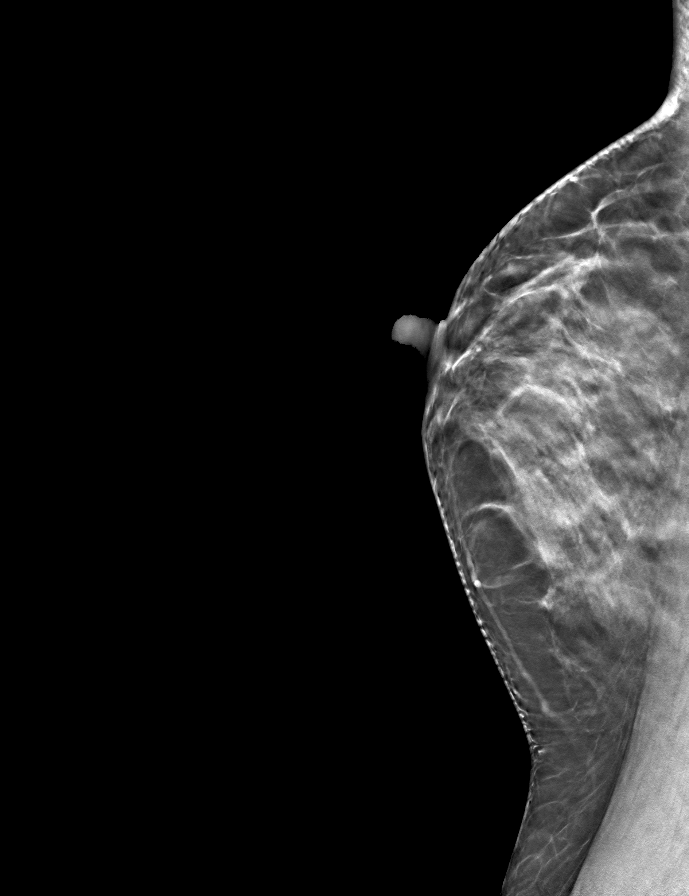

[L CC tomo · tomo slice 29/57.0]
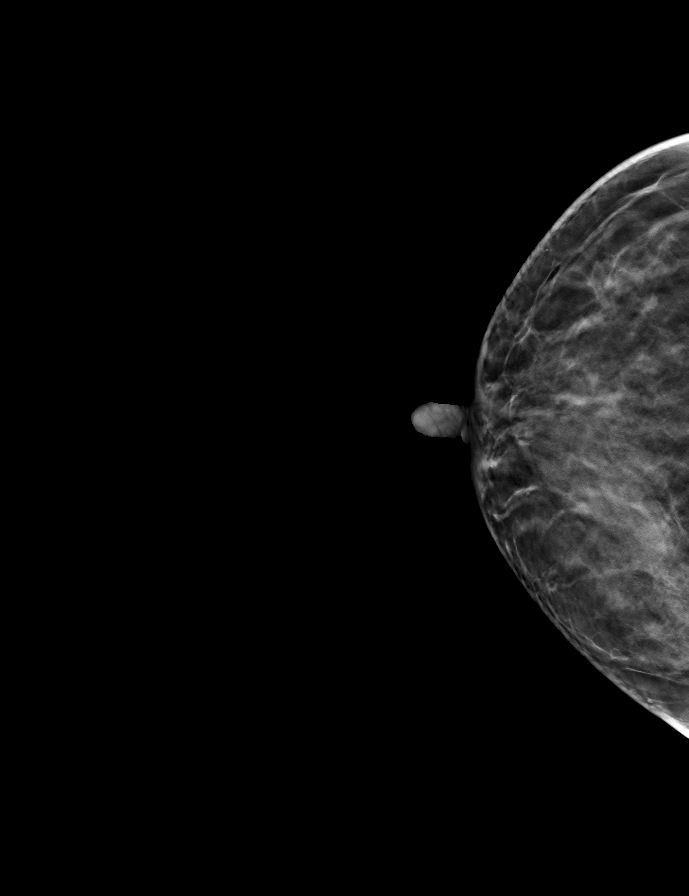

[R CC tomo · tomo slice 27/52.0]
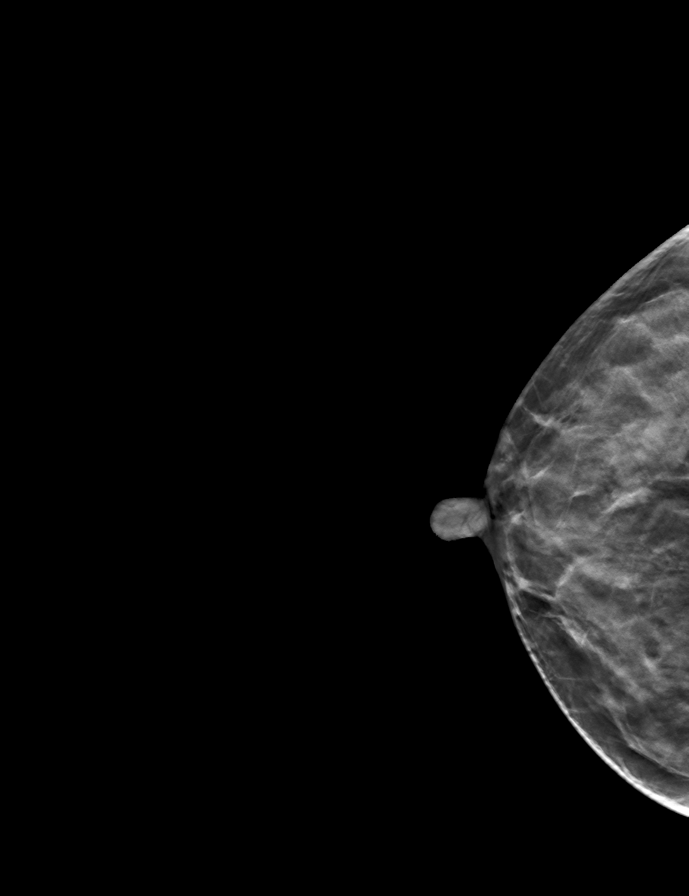

[9 of 24 positions shown; findings below may reference images not displayed]

ACR Breast Density Category c: The breast tissue is heterogeneously
dense, which may obscure small masses.
FINDINGS: There are no findings suspicious for malignancy.
IMPRESSION: No mammographic evidence of malignancy. A result letter of this
screening mammogram will be mailed directly to the patient.

RECOMMENDATION:
Screening mammogram in one year. (Code:Q3-W-BC3)

BI-RADS CATEGORY  1: Negative.

## 2023-02-04 NOTE — Assessment & Plan Note (Signed)
On iron supplements No symptoms to suggest worsening

## 2023-02-04 NOTE — Progress Notes (Signed)
Date:  02/04/2023   Name:  Brittany Wiley   DOB:  03-11-1966   MRN:  [id number]   Chief Complaint: Annual Exam Brittany Wiley is a 56 y.o. female who presents today for her Complete Annual Exam. She feels well. She reports exercising - some. She reports she is sleeping poorly. Breast complaints - none.  Mammogram: 02/2022 DEXA: none Colonoscopy: 02/2022 Pap:  01/2022 neg/neg  Health Maintenance Due  Topic Date Due   HIV Screening  Never done   Zoster Vaccines- Shingrix (2 of 2) 03/27/2022   INFLUENZA VACCINE  12/25/2022   COVID-19 Vaccine (1 - 2023-24 season) Never done    Immunization History  Administered Date(s) Administered   Influenza-Unspecified 01/24/2021   Tdap 01/30/2022   Zoster Recombinant(Shingrix) 01/30/2022    Shoulder Pain  The pain is present in the right shoulder. This is a new problem. The problem occurs intermittently. The problem has been unchanged. The quality of the pain is described as aching. The pain is at a severity of 1/10. Associated symptoms include a limited range of motion. Pertinent negatives include no fever, inability to bear weight, numbness, stiffness or tingling. The symptoms are aggravated by activity. She has tried nothing for the symptoms.    Lab Results  Component Value Date   NA 140 01/30/2022   K 4.1 01/30/2022   CO2 23 01/30/2022   GLUCOSE 101 (H) 01/30/2022   BUN 7 01/30/2022   CREATININE 0.74 01/30/2022   CALCIUM 9.2 01/30/2022   EGFR 95 01/30/2022   GFRNONAA 103 10/12/2018   Lab Results  Component Value Date   CHOL 263 (H) 01/30/2022   HDL 57 01/30/2022   LDLCALC 185 (H) 01/30/2022   TRIG 116 01/30/2022   CHOLHDL 4.6 (H) 01/30/2022   Lab Results  Component Value Date   TSH 1.520 01/30/2022   Lab Results  Component Value Date   HGBA1C 5.7 (H) 01/30/2022   Lab Results  Component Value Date   WBC 4.7 01/30/2022   HGB 12.9 01/30/2022   HCT 37.9 01/30/2022   MCV 91 01/30/2022   PLT 257 01/30/2022   Lab Results   Component Value Date   ALT 12 01/30/2022   AST 20 01/30/2022   ALKPHOS 84 01/30/2022   BILITOT 0.5 01/30/2022   No results found for: "25OHVITD2", "25OHVITD3", "VD25OH"   Review of Systems  Constitutional:  Negative for chills, fatigue and fever.  HENT:  Positive for sore throat (and bad breath). Negative for congestion, hearing loss, tinnitus, trouble swallowing and voice change.   Eyes:  Negative for visual disturbance.  Respiratory:  Negative for cough, chest tightness, shortness of breath and wheezing.   Cardiovascular:  Negative for chest pain, palpitations and leg swelling.  Gastrointestinal:  Negative for abdominal pain, constipation, diarrhea and vomiting.  Endocrine: Negative for polydipsia and polyuria.  Genitourinary:  Negative for dysuria, frequency, genital sores, vaginal bleeding and vaginal discharge.  Musculoskeletal:  Positive for arthralgias (right shoulder pain - may be overwork by her job). Negative for gait problem, joint swelling and stiffness.  Skin:  Negative for color change and rash.  Neurological:  Negative for dizziness, tingling, tremors, light-headedness, numbness and headaches.  Hematological:  Negative for adenopathy. Does not bruise/bleed easily.  Psychiatric/Behavioral:  Negative for dysphoric mood and sleep disturbance. The patient is not nervous/anxious.     Patient Active Problem List   Diagnosis Date Noted   Inflamed sebaceous cyst 10/28/2022   Uterine leiomyoma 01/30/2022   Plantar  fasciitis, bilateral 01/30/2022   Anemia, iron deficiency 04/29/2021   Polyp of sigmoid colon    Hyperlipidemia, mixed 09/22/2016   Family history of diabetes mellitus 04/20/2015    No Known Allergies  Past Surgical History:  Procedure Laterality Date   CESAREAN SECTION     COLONOSCOPY WITH PROPOFOL N/A 10/21/2016   Procedure: COLONOSCOPY WITH PROPOFOL;  Surgeon: Midge Minium, MD;  Location: [facility] ENDOSCOPY;  Service: Endoscopy;  Laterality: N/A;    COLONOSCOPY WITH PROPOFOL N/A 03/17/2022   Procedure: COLONOSCOPY WITH PROPOFOL;  Surgeon: Toney Reil, MD;  Location: [facility] ENDOSCOPY;  Service: Gastroenterology;  Laterality: N/A;    Social History   Tobacco Use   Smoking status: Never   Smokeless tobacco: Never  Vaping Use   Vaping status: Never Used  Substance Use Topics   Alcohol use: Yes    Alcohol/week: 0.0 standard drinks of alcohol    Comment: rarely   Drug use: No     Medication list has been reviewed and updated.  Current Meds  Medication Sig   ferrous sulfate 325 (65 FE) MG EC tablet Take 325 mg by mouth daily with breakfast.   Omega-3 Fatty Acids (FISH OIL) 300 MG CAPS Take by mouth.       02/04/2023    8:30 AM 10/23/2022   10:38 AM 01/30/2022    9:46 AM 04/29/2021    9:32 AM  GAD 7 : Generalized Anxiety Score  Nervous, Anxious, on Edge 0 0 0 0  Control/stop worrying 0 0 0 0  Worry too much - different things 0 0 0 0  Trouble relaxing 0 0 0 0  Restless 0 0 0 0  Easily annoyed or irritable 0 0 0 0  Afraid - awful might happen 0 0 0 0  Total GAD 7 Score 0 0 0 0  Anxiety Difficulty Not difficult at all Not difficult at all Not difficult at all        02/04/2023    8:29 AM 10/23/2022   10:38 AM 01/30/2022    9:45 AM  Depression screen PHQ 2/9  Decreased Interest 0 0 0  Down, Depressed, Hopeless 0 0 0  PHQ - 2 Score 0 0 0  Altered sleeping 2 0 0  Tired, decreased energy 0 0 0  Change in appetite 0 0 0  Feeling bad or failure about yourself  0 0 0  Trouble concentrating 0 0 0  Moving slowly or fidgety/restless 0 0 0  Suicidal thoughts 0 0 0  PHQ-9 Score 2 0 0  Difficult doing work/chores Not difficult at all Not difficult at all Not difficult at all    BP Readings from Last 3 Encounters:  02/04/23 104/62  11/06/22 132/73  10/28/22 115/72    Physical Exam Vitals and nursing note reviewed.  Constitutional:      General: She is not in acute distress.    Appearance: She is well-developed.   HENT:     Head: Normocephalic and atraumatic.     Right Ear: Tympanic membrane and ear canal normal.     Left Ear: Tympanic membrane and ear canal normal.     Nose:     Right Sinus: No maxillary sinus tenderness.     Left Sinus: No maxillary sinus tenderness.     Mouth/Throat:     Pharynx: No pharyngeal swelling or oropharyngeal exudate.     Comments: Few small tonsilliths bilaterally Eyes:     General: No scleral icterus.  Right eye: No discharge.        Left eye: No discharge.     Conjunctiva/sclera: Conjunctivae normal.  Neck:     Thyroid: No thyromegaly.     Vascular: No carotid bruit.  Cardiovascular:     Rate and Rhythm: Normal rate and regular rhythm.     Pulses: Normal pulses.     Heart sounds: Normal heart sounds.  Pulmonary:     Effort: Pulmonary effort is normal. No respiratory distress.     Breath sounds: No wheezing.  Chest:  Breasts:    Right: No mass, nipple discharge, skin change or tenderness.     Left: No mass, nipple discharge, skin change or tenderness.  Abdominal:     General: Bowel sounds are normal.     Palpations: Abdomen is soft.     Tenderness: There is no abdominal tenderness.  Musculoskeletal:     Right shoulder: No swelling, laceration, tenderness or bony tenderness. Decreased range of motion.     Cervical back: Normal range of motion. No erythema.     Right lower leg: No edema.     Left lower leg: No edema.  Lymphadenopathy:     Cervical: No cervical adenopathy.  Skin:    General: Skin is warm and dry.     Findings: No rash.  Neurological:     Mental Status: She is alert and oriented to person, place, and time.     Cranial Nerves: No cranial nerve deficit.     Sensory: No sensory deficit.     Deep Tendon Reflexes: Reflexes are normal and symmetric.  Psychiatric:        Attention and Perception: Attention normal.        Mood and Affect: Mood normal.     Wt Readings from Last 3 Encounters:  02/04/23 155 lb (70.3 kg)  11/06/22  148 lb 12.8 oz (67.5 kg)  10/28/22 150 lb 12.8 oz (68.4 kg)    BP 104/62   Pulse 66   Ht 5\' 7"  (1.702 m)   Wt 155 lb (70.3 kg)   SpO2 99%   BMI 24.28 kg/m   Assessment and Plan:  Problem List Items Addressed This Visit       Unprioritized   Hyperlipidemia, mixed (Chronic)    Moderate elevation in LDL On Omega-3 FA and lifestyle changes Lab Results  Component Value Date   LDLCALC 185 (H) 01/30/2022         Relevant Orders   Lipid panel   Anemia, iron deficiency (Chronic)    On iron supplements No symptoms to suggest worsening      Relevant Orders   CBC with Differential/Platelet   Other Visit Diagnoses     Annual physical exam    -  Primary   continue to work on healthy diet add exercise daily Flu vaccine today   Relevant Orders   CBC with Differential/Platelet   Comprehensive metabolic panel   Hemoglobin A1c   Lipid panel   Encounter for screening mammogram for breast cancer       Relevant Orders   MM 3D SCREENING MAMMOGRAM BILATERAL BREAST   Prediabetes       continue diet and lifestyle changes   Relevant Orders   Comprehensive metabolic panel   Hemoglobin A1c   Chronic right shoulder pain       try to limit aggravating activities can use Tylenol q6h PRN   Need for influenza vaccination       Relevant Orders  Flu vaccine trivalent PF, 6mos and older(Flulaval,Afluria,Fluarix,Fluzone)       Return in about 1 year (around 02/04/2024) for CPX.    Reubin Milan, MD [facility] [place]

## 2023-02-04 NOTE — Patient Instructions (Addendum)
Call [facility] to schedule your mammogram at [phone].  You can take Tylenol as needed for shoulder pain.  Consider seeing an ENT for evaluation and treatment options for recurrent tonsilliths.

## 2023-02-04 NOTE — Assessment & Plan Note (Signed)
Moderate elevation in LDL On Omega-3 FA and lifestyle changes Lab Results  Component Value Date   LDLCALC 185 (H) 01/30/2022

## 2023-02-05 LAB — LIPID PANEL
Chol/HDL Ratio: 4 ratio (ref 0.0–4.4)
Cholesterol, Total: 284 mg/dL — ABNORMAL HIGH (ref 100–199)
HDL: 71 mg/dL (ref >39)
LDL Chol Calc (NIH): 203 mg/dL — ABNORMAL HIGH (ref 0–99)
Triglycerides: 68 mg/dL (ref 0–149)
VLDL Cholesterol Cal: 10 mg/dL (ref 5–40)

## 2023-02-05 LAB — COMPREHENSIVE METABOLIC PANEL
ALT: 11 IU/L (ref 0–32)
AST: 15 IU/L (ref 0–40)
Albumin: 4.5 g/dL (ref 3.8–4.9)
Alkaline Phosphatase: 86 IU/L (ref 44–121)
BUN/Creatinine Ratio: 13 (ref 9–23)
BUN: 9 mg/dL (ref 6–24)
Bilirubin Total: 0.3 mg/dL (ref 0.0–1.2)
CO2: 24 mmol/L (ref 20–29)
Calcium: 9.2 mg/dL (ref 8.7–10.2)
Chloride: 105 mmol/L (ref 96–106)
Creatinine, Ser: 0.71 mg/dL (ref 0.57–1.00)
Globulin, Total: 2.6 g/dL (ref 1.5–4.5)
Glucose: 102 mg/dL — ABNORMAL HIGH (ref 70–99)
Potassium: 4.5 mmol/L (ref 3.5–5.2)
Sodium: 140 mmol/L (ref 134–144)
Total Protein: 7.1 g/dL (ref 6.0–8.5)
eGFR: 99 mL/min/{1.73_m2} (ref >59)

## 2023-02-05 LAB — CBC WITH DIFFERENTIAL/PLATELET
Basophils Absolute: 0 10*3/uL (ref 0.0–0.2)
Basos: 1 %
EOS (ABSOLUTE): 0.2 10*3/uL (ref 0.0–0.4)
Eos: 5 %
Hematocrit: 40 % (ref 34.0–46.6)
Hemoglobin: 13.2 g/dL (ref 11.1–15.9)
Immature Grans (Abs): 0 10*3/uL (ref 0.0–0.1)
Immature Granulocytes: 0 %
Lymphocytes Absolute: 1.6 10*3/uL (ref 0.7–3.1)
Lymphs: 40 %
MCH: 31.5 pg (ref 26.6–33.0)
MCHC: 33 g/dL (ref 31.5–35.7)
MCV: 96 fL (ref 79–97)
Monocytes Absolute: 0.3 10*3/uL (ref 0.1–0.9)
Monocytes: 6 %
Neutrophils Absolute: 2 10*3/uL (ref 1.4–7.0)
Neutrophils: 48 %
Platelets: 255 10*3/uL (ref 150–450)
RBC: 4.19 x10E6/uL (ref 3.77–5.28)
RDW: 13.1 % (ref 11.7–15.4)
WBC: 4.2 10*3/uL (ref 3.4–10.8)

## 2023-02-05 LAB — HEMOGLOBIN A1C
Est. average glucose Bld gHb Est-mCnc: 120 mg/dL
Hgb A1c MFr Bld: 5.8 % — ABNORMAL HIGH (ref 4.8–5.6)

## 2023-04-08 ENCOUNTER — Ambulatory Visit
Admission: RE | Admit: 2023-04-08 | Discharge: 2023-04-08 | Disposition: A | Discharge status: Home / Self Care | Payer: No Typology Code available for payment source | Source: Ambulatory Visit | Attending: Internal Medicine | Admitting: Internal Medicine

## 2023-04-08 DIAGNOSIS — Z1231 Encounter for screening mammogram for malignant neoplasm of breast: Secondary | ICD-10-CM | POA: Diagnosis present

## 2024-02-08 ENCOUNTER — Encounter: Payer: Self-pay | Admitting: Internal Medicine

## 2024-02-08 ENCOUNTER — Ambulatory Visit (INDEPENDENT_AMBULATORY_CARE_PROVIDER_SITE_OTHER): Payer: Self-pay | Admitting: Internal Medicine

## 2024-02-08 VITALS — BP 110/62 | HR 75 | Ht 67.0 in | Wt 158.2 lb

## 2024-02-08 DIAGNOSIS — E782 Mixed hyperlipidemia: Secondary | ICD-10-CM | POA: Diagnosis not present

## 2024-02-08 DIAGNOSIS — D509 Iron deficiency anemia, unspecified: Secondary | ICD-10-CM

## 2024-02-08 DIAGNOSIS — Z1231 Encounter for screening mammogram for malignant neoplasm of breast: Secondary | ICD-10-CM

## 2024-02-08 DIAGNOSIS — Z0000 Encounter for general adult medical examination without abnormal findings: Secondary | ICD-10-CM

## 2024-02-08 DIAGNOSIS — R7303 Prediabetes: Secondary | ICD-10-CM

## 2024-02-08 NOTE — Assessment & Plan Note (Signed)
 Not currently taking iron supplements. Diet is varied; no longer having a menstrual cycle

## 2024-02-08 NOTE — Assessment & Plan Note (Signed)
 Cholesterol managed with diet and exercise. Lab Results  Component Value Date   LDLCALC 203 (H) 02/04/2023  The 10-year ASCVD risk score (Arnett DK, et al., 2019) is: 2.2%   Values used to calculate the score:     Age: 58 years     Clincally relevant sex: Female     Is Non-Hispanic African American: No     Diabetic: No     Tobacco smoker: No     Systolic Blood Pressure: 110 mmHg     Is BP treated: No     HDL Cholesterol: 71 mg/dL     Total Cholesterol: 284 mg/dL

## 2024-02-08 NOTE — Progress Notes (Signed)
 Date:  02/08/2024   Name:  Korie X Steen   DOB:  01-20-1966   MRN:  [id number]   Chief Complaint: Annual Exam Diyana ERYNN VACA is a 58 y.o. female who presents today for her Complete Annual Exam. She feels well. She reports exercising daily. She reports she is sleeping poorly. Breast complaints none.  No menstrual cycle in the past 2 years.  She is planning to take a year off to help her pregnant daughter expecting in March.  Health Maintenance  Topic Date Due   Breast Cancer Screening  04/07/2024   COVID-19 Vaccine (1 - 2024-25 season) 02/24/2024*   Zoster (Shingles) Vaccine (2 of 2) 05/09/2024*   Flu Shot  08/23/2024*   Pneumococcal Vaccine for age over 30 (1 of 1 - PCV) 02/07/2025*   Hepatitis B Vaccine (1 of 3 - 19+ 3-dose series) 02/07/2025*   HIV Screening  02/07/2025*   Pap with HPV screening  01/31/2027   Colon Cancer Screening  03/18/2027   DTaP/Tdap/Td vaccine (2 - Td or Tdap) 01/31/2032   Hepatitis C Screening  Completed   HPV Vaccine  Aged Out   Meningitis B Vaccine  Aged Out  *Topic was postponed. The date shown is not the original due date.    HPI  Review of Systems  Constitutional:  Negative for fatigue and unexpected weight change.  HENT:  Negative for trouble swallowing.   Eyes:  Negative for visual disturbance.  Respiratory:  Negative for cough, chest tightness, shortness of breath and wheezing.   Cardiovascular:  Negative for chest pain, palpitations and leg swelling.  Gastrointestinal:  Negative for abdominal pain, constipation and diarrhea.  Musculoskeletal:  Negative for arthralgias and myalgias.  Neurological:  Negative for dizziness, weakness, light-headedness and headaches.     Lab Results  Component Value Date   NA 140 02/04/2023   K 4.5 02/04/2023   CO2 24 02/04/2023   GLUCOSE 102 (H) 02/04/2023   BUN 9 02/04/2023   CREATININE 0.71 02/04/2023   CALCIUM 9.2 02/04/2023   EGFR 99 02/04/2023   GFRNONAA 103 10/12/2018   Lab Results  Component  Value Date   CHOL 284 (H) 02/04/2023   HDL 71 02/04/2023   LDLCALC 203 (H) 02/04/2023   TRIG 68 02/04/2023   CHOLHDL 4.0 02/04/2023   Lab Results  Component Value Date   TSH 1.520 01/30/2022   Lab Results  Component Value Date   HGBA1C 5.8 (H) 02/04/2023   Lab Results  Component Value Date   WBC 4.2 02/04/2023   HGB 13.2 02/04/2023   HCT 40.0 02/04/2023   MCV 96 02/04/2023   PLT 255 02/04/2023   Lab Results  Component Value Date   ALT 11 02/04/2023   AST 15 02/04/2023   ALKPHOS 86 02/04/2023   BILITOT 0.3 02/04/2023   No results found for: MARIEN BOLLS, VD25OH   Patient Active Problem List   Diagnosis Date Noted   Inflamed sebaceous cyst 10/28/2022   Uterine leiomyoma 01/30/2022   Plantar fasciitis, bilateral 01/30/2022   Anemia, iron deficiency 04/29/2021   Polyp of sigmoid colon    Hyperlipidemia, mixed 09/22/2016   Family history of diabetes mellitus 04/20/2015    No Known Allergies  Past Surgical History:  Procedure Laterality Date   CESAREAN SECTION     COLONOSCOPY WITH PROPOFOL  N/A 10/21/2016   Procedure: COLONOSCOPY WITH PROPOFOL ;  Surgeon: Jinny Carmine, MD;  Location: [facility] ENDOSCOPY;  Service: Endoscopy;  Laterality: N/A;   COLONOSCOPY WITH PROPOFOL  N/A  03/17/2022   Procedure: COLONOSCOPY WITH PROPOFOL ;  Surgeon: Unk Corinn Skiff, MD;  Location[facility] ENDOSCOPY;  Service: Gastroenterology;  Laterality: N/A;    Social History   Tobacco Use   Smoking status: Never   Smokeless tobacco: Never  Vaping Use   Vaping status: Never Used  Substance Use Topics   Alcohol use: Yes    Alcohol/week: 0.0 standard drinks of alcohol    Comment: rarely   Drug use: No     Medication list has been reviewed and updated.  Current Meds  Medication Sig   ferrous sulfate 325 (65 FE) MG EC tablet Take 325 mg by mouth daily with breakfast.   Omega-3 Fatty Acids (FISH OIL) 300 MG CAPS Take by mouth.       02/08/2024    8:00 AM 02/04/2023    8:30  AM 10/23/2022   10:38 AM 01/30/2022    9:46 AM  GAD 7 : Generalized Anxiety Score  Nervous, Anxious, on Edge 0 0 0 0  Control/stop worrying 0 0 0 0  Worry too much - different things 0 0 0 0  Trouble relaxing 0 0 0 0  Restless 0 0 0 0  Easily annoyed or irritable 0 0 0 0  Afraid - awful might happen 0 0 0 0  Total GAD 7 Score 0 0 0 0  Anxiety Difficulty Not difficult at all Not difficult at all Not difficult at all Not difficult at all       02/08/2024    7:59 AM 02/04/2023    8:29 AM 10/23/2022   10:38 AM  Depression screen PHQ 2/9  Decreased Interest 0 0 0  Down, Depressed, Hopeless 0 0 0  PHQ - 2 Score 0 0 0  Altered sleeping 2 2 0  Tired, decreased energy 1 0 0  Change in appetite 0 0 0  Feeling bad or failure about yourself  0 0 0  Trouble concentrating 0 0 0  Moving slowly or fidgety/restless 0 0 0  Suicidal thoughts 0 0 0  PHQ-9 Score 3 2 0  Difficult doing work/chores Not difficult at all Not difficult at all Not difficult at all    BP Readings from Last 3 Encounters:  02/08/24 110/62  02/04/23 104/62  11/06/22 132/73    Physical Exam Vitals and nursing note reviewed.  Constitutional:      General: She is not in acute distress.    Appearance: She is well-developed.  HENT:     Head: Normocephalic and atraumatic.     Right Ear: Tympanic membrane and ear canal normal.     Left Ear: Tympanic membrane and ear canal normal.     Nose:     Right Sinus: No maxillary sinus tenderness.     Left Sinus: No maxillary sinus tenderness.  Eyes:     General: No scleral icterus.       Right eye: No discharge.        Left eye: No discharge.     Conjunctiva/sclera: Conjunctivae normal.  Neck:     Thyroid: No thyromegaly.     Vascular: No carotid bruit.  Cardiovascular:     Rate and Rhythm: Normal rate and regular rhythm.     Pulses: Normal pulses.     Heart sounds: Normal heart sounds.  Pulmonary:     Effort: Pulmonary effort is normal. No respiratory distress.      Breath sounds: No wheezing.  Abdominal:     General: Bowel sounds are normal.  Palpations: Abdomen is soft.     Tenderness: There is no abdominal tenderness.  Musculoskeletal:     Cervical back: Normal range of motion. No erythema.     Right lower leg: No edema.     Left lower leg: No edema.  Lymphadenopathy:     Cervical: No cervical adenopathy.  Skin:    General: Skin is warm and dry.     Findings: No rash.  Neurological:     Mental Status: She is alert and oriented to person, place, and time.     Cranial Nerves: No cranial nerve deficit.     Sensory: No sensory deficit.     Deep Tendon Reflexes: Reflexes are normal and symmetric.  Psychiatric:        Attention and Perception: Attention normal.        Mood and Affect: Mood normal.     Wt Readings from Last 3 Encounters:  02/08/24 158 lb 3.2 oz (71.8 kg)  02/04/23 155 lb (70.3 kg)  11/06/22 148 lb 12.8 oz (67.5 kg)    BP 110/62   Pulse 75   Ht 5' 7 (1.702 m)   Wt 158 lb 3.2 oz (71.8 kg)   SpO2 99%   BMI 24.78 kg/m   Assessment and Plan:  Problem List Items Addressed This Visit       Unprioritized   Hyperlipidemia, mixed (Chronic)   Cholesterol managed with diet and exercise. Lab Results  Component Value Date   LDLCALC 203 (H) 02/04/2023  The 10-year ASCVD risk score (Arnett DK, et al., 2019) is: 2.2%   Values used to calculate the score:     Age: 64 years     Clincally relevant sex: Female     Is Non-Hispanic African American: No     Diabetic: No     Tobacco smoker: No     Systolic Blood Pressure: 110 mmHg     Is BP treated: No     HDL Cholesterol: 71 mg/dL     Total Cholesterol: 284 mg/dL        Relevant Orders   Lipid panel   Anemia, iron deficiency (Chronic)   Not currently taking iron supplements. Diet is varied; no longer having a menstrual cycle      Relevant Orders   CBC with Differential/Platelet   Iron, TIBC and Ferritin Panel   Other Visit Diagnoses       Annual physical  exam    -  Primary   Schedule Mammogram in November other screenings up to date   Relevant Orders   CBC with Differential/Platelet   Comprehensive metabolic panel with GFR   Iron, TIBC and Ferritin Panel   Hemoglobin A1c   Lipid panel   TSH     Encounter for screening mammogram for breast cancer       Relevant Orders   MM 3D SCREENING MAMMOGRAM BILATERAL BREAST     Prediabetes       Relevant Orders   Comprehensive metabolic panel with GFR   Hemoglobin A1c       No follow-ups on file.    Leita HILARIO Adie, MD [facility] [place]

## 2024-02-08 NOTE — Patient Instructions (Signed)
 Call [facility] to schedule your mammogram at [phone].

## 2024-02-09 ENCOUNTER — Ambulatory Visit: Payer: Self-pay | Admitting: Internal Medicine

## 2024-02-09 LAB — CBC WITH DIFFERENTIAL/PLATELET
Basophils Absolute: 0.1 x10E3/uL (ref 0.0–0.2)
Basos: 1 %
EOS (ABSOLUTE): 0.2 x10E3/uL (ref 0.0–0.4)
Eos: 4 %
Hematocrit: 40 % (ref 34.0–46.6)
Hemoglobin: 13.1 g/dL (ref 11.1–15.9)
Immature Grans (Abs): 0 x10E3/uL (ref 0.0–0.1)
Immature Granulocytes: 0 %
Lymphocytes Absolute: 1.8 x10E3/uL (ref 0.7–3.1)
Lymphs: 40 %
MCH: 31.3 pg (ref 26.6–33.0)
MCHC: 32.8 g/dL (ref 31.5–35.7)
MCV: 96 fL (ref 79–97)
Monocytes Absolute: 0.3 x10E3/uL (ref 0.1–0.9)
Monocytes: 6 %
Neutrophils Absolute: 2.2 x10E3/uL (ref 1.4–7.0)
Neutrophils: 49 %
Platelets: 250 x10E3/uL (ref 150–450)
RBC: 4.19 x10E6/uL (ref 3.77–5.28)
RDW: 13.1 % (ref 11.7–15.4)
WBC: 4.5 x10E3/uL (ref 3.4–10.8)

## 2024-02-09 LAB — HEMOGLOBIN A1C
Est. average glucose Bld gHb Est-mCnc: 108 mg/dL
Hgb A1c MFr Bld: 5.4 % (ref 4.8–5.6)

## 2024-02-09 LAB — COMPREHENSIVE METABOLIC PANEL WITH GFR
ALT: 12 IU/L (ref 0–32)
AST: 16 IU/L (ref 0–40)
Albumin: 4.4 g/dL (ref 3.8–4.9)
Alkaline Phosphatase: 89 IU/L (ref 51–125)
BUN/Creatinine Ratio: 18 (ref 9–23)
BUN: 13 mg/dL (ref 6–24)
Bilirubin Total: 0.3 mg/dL (ref 0.0–1.2)
CO2: 21 mmol/L (ref 20–29)
Calcium: 9.2 mg/dL (ref 8.7–10.2)
Chloride: 105 mmol/L (ref 96–106)
Creatinine, Ser: 0.71 mg/dL (ref 0.57–1.00)
Globulin, Total: 2.5 g/dL (ref 1.5–4.5)
Glucose: 105 mg/dL — ABNORMAL HIGH (ref 70–99)
Potassium: 4.2 mmol/L (ref 3.5–5.2)
Sodium: 139 mmol/L (ref 134–144)
Total Protein: 6.9 g/dL (ref 6.0–8.5)
eGFR: 98 mL/min/1.73 (ref >59)

## 2024-02-09 LAB — IRON,TIBC AND FERRITIN PANEL
Ferritin: 164 ng/mL — ABNORMAL HIGH (ref 15–150)
Iron Saturation: 20 % (ref 15–55)
Iron: 54 ug/dL (ref 27–159)
Total Iron Binding Capacity: 274 ug/dL (ref 250–450)
UIBC: 220 ug/dL (ref 131–425)

## 2024-02-09 LAB — TSH: TSH: 1.81 u[IU]/mL (ref 0.450–4.500)

## 2024-02-09 LAB — LIPID PANEL
Chol/HDL Ratio: 4.8 ratio — ABNORMAL HIGH (ref 0.0–4.4)
Cholesterol, Total: 266 mg/dL — ABNORMAL HIGH (ref 100–199)
HDL: 56 mg/dL (ref >39)
LDL Chol Calc (NIH): 190 mg/dL — ABNORMAL HIGH (ref 0–99)
Triglycerides: 114 mg/dL (ref 0–149)
VLDL Cholesterol Cal: 20 mg/dL (ref 5–40)

## 2024-05-31 ENCOUNTER — Telehealth: Payer: Self-pay

## 2024-05-31 ENCOUNTER — Telehealth: Payer: Self-pay | Admitting: Internal Medicine

## 2024-05-31 NOTE — Telephone Encounter (Signed)
 Spoke with patient and advised her to find PCP in [place] and get them to order since she will be there for an extended amount of time. She then remembered Dr. Justus placed a mammogram and she never had it done. She said she will make appt here fly in and get done.   JM   Copied from CRM [id number]. Topic: Referral - Question >> May 30, 2024 11:41 AM Gattis SQUIBB wrote: Reason for CRM: Patient called saying she is [place] and needs to have a mammogram done.  She found a knot in her breast and will not be back here until May.  [phone]

## 2024-05-31 NOTE — Telephone Encounter (Signed)
 Copied from CRM [id number]. Topic: Referral - Question >> May 31, 2024  8:58 AM Deaijah H wrote: Reason for CRM: Patient called in wanting to get a mammogram done quickly due to finding knot on breast. Last appointment with Justus was 01/2024. Please call to advise whether a referral will be sent or a TOC will be needed first.     ----------------------------------------------------------------------- From previous Reason for Contact - Scheduling: Patient/patient representative is calling to schedule an appointment. Refer to attachments for appointment information.

## 2024-05-31 NOTE — Telephone Encounter (Signed)
Where does the order need to go?
